# Patient Record
Sex: Female | Born: 1980 | Race: White | Hispanic: No | Marital: Single | Smoking: Never smoker
Health system: Southern US, Community
[De-identification: ages and names within clinical notes are randomized; demographics above are authoritative.]

## PROBLEM LIST (undated history)

## (undated) DIAGNOSIS — E119 Type 2 diabetes mellitus without complications: Secondary | ICD-10-CM

## (undated) HISTORY — PX: TOE AMPUTATION: SHX809

---

## 2005-11-27 ENCOUNTER — Inpatient Hospital Stay: Payer: Self-pay | Admitting: Internal Medicine

## 2008-04-19 IMAGING — CR DG CHEST 1V PORT
1 series · 1 of 1 positions shown · non-contrast
Comparison: none

REASON FOR EXAM: chest pain  weakness  [HOSPITAL]
COMMENTS:

PROCEDURE:     DXR - DXR PORTABLE CHEST SINGLE VIEW  - November 26, 2005 [DATE]
RESULT:     AP view of the chest shows the lung fields to be clear. The
heart, mediastinal and osseous structures show no significant abnormalities.

[view not recorded]
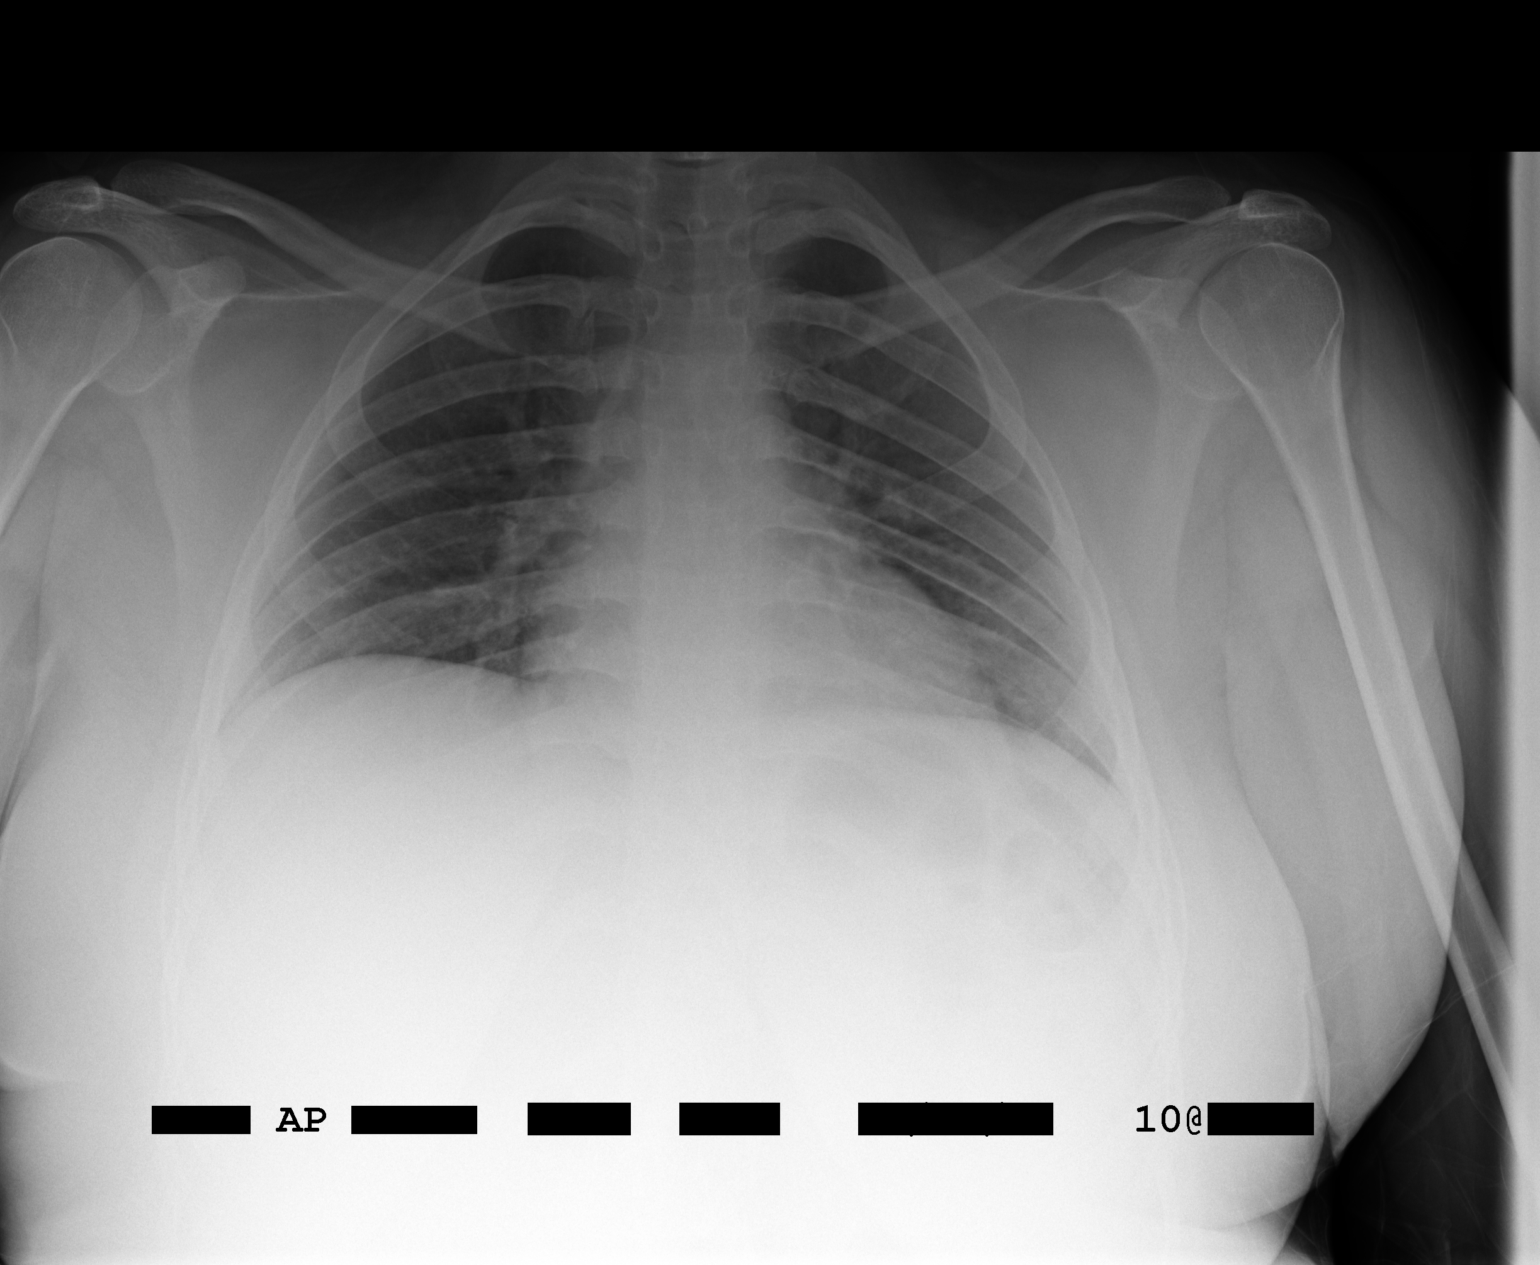

[1 of 1 positions shown; findings below may reference images not displayed]

IMPRESSION: 1)No significant abnormalities are noted.

## 2013-06-01 ENCOUNTER — Emergency Department: Payer: Self-pay | Admitting: Emergency Medicine

## 2014-10-16 ENCOUNTER — Other Ambulatory Visit
Admission: RE | Admit: 2014-10-16 | Discharge: 2014-10-16 | Disposition: A | Discharge status: Home / Self Care | Payer: No Typology Code available for payment source | Source: Ambulatory Visit | Attending: Family Medicine | Admitting: Family Medicine

## 2014-10-16 DIAGNOSIS — M869 Osteomyelitis, unspecified: Secondary | ICD-10-CM | POA: Insufficient documentation

## 2014-10-16 LAB — VANCOMYCIN, TROUGH: Vancomycin Tr: 24 ug/mL (ref 10–20)

## 2014-10-19 ENCOUNTER — Other Ambulatory Visit
Admission: RE | Admit: 2014-10-19 | Discharge: 2014-10-19 | Disposition: A | Discharge status: Home / Self Care | Payer: No Typology Code available for payment source | Source: Skilled Nursing Facility | Attending: Family Medicine | Admitting: Family Medicine

## 2014-10-19 DIAGNOSIS — F339 Major depressive disorder, recurrent, unspecified: Secondary | ICD-10-CM | POA: Insufficient documentation

## 2014-10-19 DIAGNOSIS — E119 Type 2 diabetes mellitus without complications: Secondary | ICD-10-CM | POA: Insufficient documentation

## 2014-10-19 DIAGNOSIS — Z79899 Other long term (current) drug therapy: Secondary | ICD-10-CM | POA: Insufficient documentation

## 2014-10-19 DIAGNOSIS — R279 Unspecified lack of coordination: Secondary | ICD-10-CM | POA: Diagnosis not present

## 2014-10-19 DIAGNOSIS — M86172 Other acute osteomyelitis, left ankle and foot: Secondary | ICD-10-CM | POA: Insufficient documentation

## 2014-10-19 DIAGNOSIS — E780 Pure hypercholesterolemia: Secondary | ICD-10-CM | POA: Diagnosis not present

## 2014-10-20 LAB — VANCOMYCIN, TROUGH: Vancomycin Tr: 12 ug/mL (ref 10–20)

## 2014-11-14 ENCOUNTER — Emergency Department
Admission: EM | Admit: 2014-11-14 | Discharge: 2014-11-14 | Disposition: A | Discharge status: Home / Self Care | Payer: Medicare Other

## 2017-05-04 ENCOUNTER — Emergency Department
Admission: EM | Admit: 2017-05-04 | Discharge: 2017-05-04 | Disposition: A | Discharge status: Home / Self Care | Payer: Medicare Other | Attending: Emergency Medicine | Admitting: Emergency Medicine

## 2017-05-04 DIAGNOSIS — S39012A Strain of muscle, fascia and tendon of lower back, initial encounter: Secondary | ICD-10-CM | POA: Diagnosis not present

## 2017-05-04 DIAGNOSIS — E119 Type 2 diabetes mellitus without complications: Secondary | ICD-10-CM | POA: Insufficient documentation

## 2017-05-04 DIAGNOSIS — Y9389 Activity, other specified: Secondary | ICD-10-CM | POA: Insufficient documentation

## 2017-05-04 DIAGNOSIS — Y999 Unspecified external cause status: Secondary | ICD-10-CM | POA: Insufficient documentation

## 2017-05-04 DIAGNOSIS — Z89429 Acquired absence of other toe(s), unspecified side: Secondary | ICD-10-CM | POA: Insufficient documentation

## 2017-05-04 DIAGNOSIS — Y92410 Unspecified street and highway as the place of occurrence of the external cause: Secondary | ICD-10-CM | POA: Insufficient documentation

## 2017-05-04 DIAGNOSIS — S3992XA Unspecified injury of lower back, initial encounter: Secondary | ICD-10-CM | POA: Diagnosis present

## 2017-05-04 HISTORY — DX: Type 2 diabetes mellitus without complications: E11.9

## 2017-05-04 MED ORDER — ONDANSETRON 4 MG PO TBDP
ORAL_TABLET | ORAL | Status: AC
  Filled 2017-05-04: qty 1

## 2017-05-04 MED ORDER — OXYCODONE-ACETAMINOPHEN 5-325 MG PO TABS
1.0000 | ORAL_TABLET | Freq: Once | ORAL | Status: AC
  Administered 2017-05-04: 1 via ORAL
  Filled 2017-05-04: qty 1

## 2017-05-04 MED ORDER — ONDANSETRON 4 MG PO TBDP
4.0000 mg | ORAL_TABLET | Freq: Once | ORAL | Status: AC
  Administered 2017-05-04: 4 mg via ORAL

## 2017-05-04 MED ORDER — ONDANSETRON HCL 4 MG/2ML IJ SOLN: 4.0000 mg | Freq: Once | INTRAMUSCULAR | Status: DC
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
## 2023-02-24 ENCOUNTER — Other Ambulatory Visit: Payer: Self-pay

## 2023-02-24 ENCOUNTER — Emergency Department: Payer: 59

## 2023-02-24 ENCOUNTER — Inpatient Hospital Stay
Admission: EM | Admit: 2023-02-24 | Discharge: 2023-02-26 | DRG: 065 | Disposition: A | Discharge status: Home / Self Care | Payer: 59 | Attending: Internal Medicine | Admitting: Internal Medicine

## 2023-02-24 DIAGNOSIS — Z89421 Acquired absence of other right toe(s): Secondary | ICD-10-CM

## 2023-02-24 DIAGNOSIS — Z6837 Body mass index (BMI) 37.0-37.9, adult: Secondary | ICD-10-CM

## 2023-02-24 DIAGNOSIS — I10 Essential (primary) hypertension: Secondary | ICD-10-CM | POA: Diagnosis present

## 2023-02-24 DIAGNOSIS — R270 Ataxia, unspecified: Secondary | ICD-10-CM | POA: Diagnosis present

## 2023-02-24 DIAGNOSIS — Z8619 Personal history of other infectious and parasitic diseases: Secondary | ICD-10-CM

## 2023-02-24 DIAGNOSIS — R4781 Slurred speech: Principal | ICD-10-CM

## 2023-02-24 DIAGNOSIS — R29702 NIHSS score 2: Secondary | ICD-10-CM | POA: Diagnosis present

## 2023-02-24 DIAGNOSIS — R414 Neurologic neglect syndrome: Secondary | ICD-10-CM | POA: Diagnosis present

## 2023-02-24 DIAGNOSIS — Z8249 Family history of ischemic heart disease and other diseases of the circulatory system: Secondary | ICD-10-CM

## 2023-02-24 DIAGNOSIS — F419 Anxiety disorder, unspecified: Secondary | ICD-10-CM | POA: Diagnosis present

## 2023-02-24 DIAGNOSIS — Z794 Long term (current) use of insulin: Secondary | ICD-10-CM

## 2023-02-24 DIAGNOSIS — E782 Mixed hyperlipidemia: Secondary | ICD-10-CM | POA: Diagnosis present

## 2023-02-24 DIAGNOSIS — R29810 Facial weakness: Secondary | ICD-10-CM | POA: Diagnosis present

## 2023-02-24 DIAGNOSIS — Z634 Disappearance and death of family member: Secondary | ICD-10-CM

## 2023-02-24 DIAGNOSIS — R7401 Elevation of levels of liver transaminase levels: Secondary | ICD-10-CM | POA: Diagnosis present

## 2023-02-24 DIAGNOSIS — I6523 Occlusion and stenosis of bilateral carotid arteries: Secondary | ICD-10-CM | POA: Diagnosis present

## 2023-02-24 DIAGNOSIS — I639 Cerebral infarction, unspecified: Secondary | ICD-10-CM | POA: Diagnosis present

## 2023-02-24 DIAGNOSIS — Z885 Allergy status to narcotic agent status: Secondary | ICD-10-CM

## 2023-02-24 DIAGNOSIS — R29701 NIHSS score 1: Secondary | ICD-10-CM | POA: Diagnosis not present

## 2023-02-24 DIAGNOSIS — R471 Dysarthria and anarthria: Secondary | ICD-10-CM | POA: Diagnosis present

## 2023-02-24 DIAGNOSIS — E1140 Type 2 diabetes mellitus with diabetic neuropathy, unspecified: Secondary | ICD-10-CM | POA: Diagnosis present

## 2023-02-24 DIAGNOSIS — R296 Repeated falls: Secondary | ICD-10-CM | POA: Diagnosis present

## 2023-02-24 DIAGNOSIS — Z7902 Long term (current) use of antithrombotics/antiplatelets: Secondary | ICD-10-CM

## 2023-02-24 DIAGNOSIS — E8809 Other disorders of plasma-protein metabolism, not elsewhere classified: Secondary | ICD-10-CM | POA: Diagnosis present

## 2023-02-24 DIAGNOSIS — E669 Obesity, unspecified: Secondary | ICD-10-CM | POA: Diagnosis present

## 2023-02-24 DIAGNOSIS — N390 Urinary tract infection, site not specified: Secondary | ICD-10-CM | POA: Insufficient documentation

## 2023-02-24 DIAGNOSIS — Z5941 Food insecurity: Secondary | ICD-10-CM

## 2023-02-24 DIAGNOSIS — R4586 Emotional lability: Secondary | ICD-10-CM | POA: Diagnosis present

## 2023-02-24 DIAGNOSIS — Z5986 Financial insecurity: Secondary | ICD-10-CM

## 2023-02-24 DIAGNOSIS — Z7982 Long term (current) use of aspirin: Secondary | ICD-10-CM

## 2023-02-24 DIAGNOSIS — E441 Mild protein-calorie malnutrition: Secondary | ICD-10-CM | POA: Diagnosis present

## 2023-02-24 DIAGNOSIS — Z888 Allergy status to other drugs, medicaments and biological substances status: Secondary | ICD-10-CM

## 2023-02-24 DIAGNOSIS — Z79899 Other long term (current) drug therapy: Secondary | ICD-10-CM

## 2023-02-24 DIAGNOSIS — N3000 Acute cystitis without hematuria: Secondary | ICD-10-CM | POA: Diagnosis present

## 2023-02-24 DIAGNOSIS — R748 Abnormal levels of other serum enzymes: Secondary | ICD-10-CM | POA: Diagnosis present

## 2023-02-24 DIAGNOSIS — I63431 Cerebral infarction due to embolism of right posterior cerebral artery: Secondary | ICD-10-CM | POA: Diagnosis not present

## 2023-02-24 DIAGNOSIS — Z5948 Other specified lack of adequate food: Secondary | ICD-10-CM

## 2023-02-24 DIAGNOSIS — Z881 Allergy status to other antibiotic agents status: Secondary | ICD-10-CM

## 2023-02-24 DIAGNOSIS — E1165 Type 2 diabetes mellitus with hyperglycemia: Secondary | ICD-10-CM | POA: Diagnosis present

## 2023-02-24 DIAGNOSIS — Z89422 Acquired absence of other left toe(s): Secondary | ICD-10-CM

## 2023-02-24 LAB — COMPREHENSIVE METABOLIC PANEL
ALT: 53 U/L — ABNORMAL HIGH (ref 0–44)
AST: 50 U/L — ABNORMAL HIGH (ref 15–41)
Albumin: 3.3 g/dL — ABNORMAL LOW (ref 3.5–5.0)
Alkaline Phosphatase: 144 U/L — ABNORMAL HIGH (ref 38–126)
Anion gap: 11 (ref 5–15)
BUN: 23 mg/dL — ABNORMAL HIGH (ref 6–20)
CO2: 23 mmol/L (ref 22–32)
Calcium: 8.8 mg/dL — ABNORMAL LOW (ref 8.9–10.3)
Chloride: 104 mmol/L (ref 98–111)
Creatinine, Ser: 0.86 mg/dL (ref 0.44–1.00)
GFR, Estimated: >60 mL/min (ref >60)
Glucose, Bld: 162 mg/dL — ABNORMAL HIGH (ref 70–99)
Potassium: 5.1 mmol/L (ref 3.5–5.1)
Sodium: 138 mmol/L (ref 135–145)
Total Bilirubin: 0.6 mg/dL (ref 0.0–1.2)
Total Protein: 7.8 g/dL (ref 6.5–8.1)

## 2023-02-24 LAB — DIFFERENTIAL
Abs Immature Granulocytes: 0.02 10*3/uL (ref 0.00–0.07)
Basophils Absolute: 0 10*3/uL (ref 0.0–0.1)
Basophils Relative: 0 %
Eosinophils Absolute: 0.3 10*3/uL (ref 0.0–0.5)
Eosinophils Relative: 4 %
Immature Granulocytes: 0 %
Lymphocytes Relative: 49 %
Lymphs Abs: 3.7 10*3/uL (ref 0.7–4.0)
Monocytes Absolute: 0.5 10*3/uL (ref 0.1–1.0)
Monocytes Relative: 7 %
Neutro Abs: 3.1 10*3/uL (ref 1.7–7.7)
Neutrophils Relative %: 40 %

## 2023-02-24 LAB — URINALYSIS, ROUTINE W REFLEX MICROSCOPIC
Bilirubin Urine: NEGATIVE
Glucose, UA: NEGATIVE mg/dL
Ketones, ur: NEGATIVE mg/dL
Nitrite: POSITIVE — AB
Protein, ur: 100 mg/dL — AB
Specific Gravity, Urine: 1.012 (ref 1.005–1.030)
pH: 5 (ref 5.0–8.0)

## 2023-02-24 LAB — URINE DRUG SCREEN, QUALITATIVE (ARMC ONLY)
Amphetamines, Ur Screen: NOT DETECTED
Barbiturates, Ur Screen: NOT DETECTED
Benzodiazepine, Ur Scrn: NOT DETECTED
Cannabinoid 50 Ng, Ur ~~LOC~~: NOT DETECTED
Cocaine Metabolite,Ur ~~LOC~~: NOT DETECTED
MDMA (Ecstasy)Ur Screen: NOT DETECTED
Methadone Scn, Ur: NOT DETECTED
Opiate, Ur Screen: NOT DETECTED
Phencyclidine (PCP) Ur S: NOT DETECTED
Tricyclic, Ur Screen: NOT DETECTED

## 2023-02-24 LAB — CBC
HCT: 38.7 % (ref 36.0–46.0)
Hemoglobin: 12.6 g/dL (ref 12.0–15.0)
MCH: 27.1 pg (ref 26.0–34.0)
MCHC: 32.6 g/dL (ref 30.0–36.0)
MCV: 83.2 fL (ref 80.0–100.0)
Platelets: 220 10*3/uL (ref 150–400)
RBC: 4.65 MIL/uL (ref 3.87–5.11)
RDW: 13.8 % (ref 11.5–15.5)
WBC: 7.7 10*3/uL (ref 4.0–10.5)
nRBC: 0 % (ref 0.0–0.2)

## 2023-02-24 LAB — PROTIME-INR
INR: 0.9 (ref 0.8–1.2)
Prothrombin Time: 12.8 s (ref 11.4–15.2)

## 2023-02-24 LAB — CBG MONITORING, ED: Glucose-Capillary: 153 mg/dL — ABNORMAL HIGH (ref 70–99)

## 2023-02-24 LAB — APTT: aPTT: 25 s (ref 24–36)

## 2023-02-24 LAB — ETHANOL: Alcohol, Ethyl (B): <10 mg/dL (ref <10)

## 2023-02-24 MED ORDER — SODIUM CHLORIDE 0.9 % IV SOLN
1.0000 g | Freq: Once | INTRAVENOUS | Status: AC
  Administered 2023-02-24: 1 g via INTRAVENOUS
  Filled 2023-02-24: qty 10

## 2023-02-24 NOTE — Progress Notes (Signed)
2133 - Code Stroke Activated   LKWT unknown, Patient on phone with her sister when her sister noticed a L facial droop. Patient complains of generalized weakness from flu, diagnosed last week. Patient has slurred/slowed speech unsure if this is new or baseline base doff EMS report family said baseline, but patient states it is different from normal. Also, complaining of left hand numbness.  mRS 0  Patient already been to CT and returned   2140 - Tele-Neurologist paged   2150 - Dr. Patricia Nettle joined stroke cart, CT head results given on camera

## 2023-02-24 NOTE — ED Triage Notes (Addendum)
Patient arrives from home via EMS and states that her sister noticed a left-sided facial droop at approximately 8PM. Patient states that she has generalized weakness for about a week. She is also noticing slowed speech and some slight slurring. Blood sugar 153

## 2023-02-24 NOTE — ED Notes (Signed)
Pt advised she was just seem at [facility] at 6pm for feeling so bad, but her mask was up the entire time so unknown if they would have noticed her facial droop either.  Pt had FLU last week.

## 2023-02-24 NOTE — ED Notes (Signed)
Tele- Neuro on screen.

## 2023-02-24 NOTE — ED Notes (Signed)
Neuro decided against TNK.

## 2023-02-24 NOTE — ED Notes (Signed)
Called to carelink/activated code stroke per MD Smith/rep Asher Muir.

## 2023-02-24 NOTE — ED Provider Notes (Addendum)
[facility] Provider Note    Event Date/Time   First MD Initiated Contact with Patient 02/24/23 2134     (approximate)   History   Facial Droop   HPI  Angel Gray is a 43 y.o. female who presents to the ED for evaluation of Facial Droop   Patient presents from home via POV for elevation of facial droop and slurred speech since 8 PM.  No falls, trauma, syncope or injuries.  Recently diagnosed with influenza A.  I review a PCP visit from 1 month ago.  Obese patient with history of HTN, HLD and DM.   Physical Exam   Triage Vital Signs: ED Triage Vitals  Encounter Vitals Group     BP 02/24/23 2119 (!) 210/83     Systolic BP Percentile --      Diastolic BP Percentile --      Pulse Rate 02/24/23 2119 97     Resp 02/24/23 2119 18     Temp --      Temp src --      SpO2 02/24/23 2119 95 %     Weight --      Height 02/24/23 2138 5\' 1"  (1.549 m)     Head Circumference --      Peak Flow --      Pain Score 02/24/23 2138 0     Pain Loc --      Pain Education --      Exclude from Growth Chart --     Most recent vital signs: Vitals:   02/24/23 2145 02/24/23 2330  BP:  (!) 158/72  Pulse: 91 86  Resp: 18 15  Temp:    SpO2: 97% 97%    General: Awake, no distress.  CV:  Good peripheral perfusion.  Resp:  Normal effort.  Abd:  No distention.  MSK:  No deformity noted.  Neuro:  No focal deficits appreciated.  Full strength and sensation to all 4 extremities but facial droop and asymmetry is noted as well as slurred speech and word finding difficulties. Other:     ED Results / Procedures / Treatments   Labs (all labs ordered are listed, but only abnormal results are displayed) Labs Reviewed  COMPREHENSIVE METABOLIC PANEL - Abnormal; Notable for the following components:      Result Value   Glucose, Bld 162 (*)    BUN 23 (*)    Calcium 8.8 (*)    Albumin 3.3 (*)    AST 50 (*)    ALT 53 (*)    Alkaline Phosphatase 144 (*)    All other  components within normal limits  URINALYSIS, ROUTINE W REFLEX MICROSCOPIC - Abnormal; Notable for the following components:   Color, Urine YELLOW (*)    APPearance HAZY (*)    Hgb urine dipstick SMALL (*)    Protein, ur 100 (*)    Nitrite POSITIVE (*)    Leukocytes,Ua TRACE (*)    Bacteria, UA MANY (*)    All other components within normal limits  CBG MONITORING, ED - Abnormal; Notable for the following components:   Glucose-Capillary 153 (*)    All other components within normal limits  URINE CULTURE  ETHANOL  PROTIME-INR  APTT  CBC  DIFFERENTIAL  URINE DRUG SCREEN, QUALITATIVE ([facility] ONLY)  POC URINE PREG, ED    EKG Sinus rhythm with a rate of 93 bpm.  Normal axis and intervals.  No clear signs of acute ischemia.  RADIOLOGY CT  head interpreted by me without evidence of acute intracranial pathology  Official radiology report(s): MR BRAIN WO CONTRAST Result Date: 02/24/2023 CLINICAL DATA:  eval cva. slurred speech and facial droop EXAM: MRI HEAD WITHOUT CONTRAST TECHNIQUE: Multiplanar, multiecho pulse sequences of the brain and surrounding structures were obtained without intravenous contrast. COMPARISON:  CT head. FINDINGS: Brain: Multiple small acute infarcts in the right frontal lobe and right parietal lobe. Additional small acute infarct in the medial right temporal lobe and infarct in the body of the corpus callosum. Edema without substantial mass effect. Remote left frontal cortical infarct. Mild to moderate scattered T2/FLAIR hyperintensities not matter compatible with chronic microvascular disease. Vascular: Major arterial flow voids are maintained. Skull and upper cervical spine: Normal marrow signal. Sinuses/Orbits: Moderate paranasal sinus mucosal thickening. No acute orbital findings. Other: No sizable mastoid effusions. 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 Electronically Signed   By:  Feliberto Harts M.D.   On: 02/24/2023 23:21   CT HEAD CODE STROKE WO CONTRAST Result Date: 02/24/2023 CLINICAL DATA:  Code stroke.  Slurred speech and facial droop EXAM: CT HEAD WITHOUT CONTRAST TECHNIQUE: Contiguous axial images were obtained from the base of the skull through the vertex without intravenous contrast. RADIATION DOSE REDUCTION: This exam was performed according to the departmental dose-optimization program which includes automated exposure control, adjustment of the mA and/or kV according to patient size and/or use of iterative reconstruction technique. COMPARISON:  None Available. FINDINGS: Brain: There is no mass, hemorrhage or extra-axial collection. The size and configuration of the ventricles and extra-axial CSF spaces are normal. The brain parenchyma is normal, without evidence of acute or chronic infarction. Incidental partially empty sella turcica. Vascular: No abnormal hyperdensity of the major intracranial arteries or dural venous sinuses. No intracranial atherosclerosis. Skull: The visualized skull base, calvarium and extracranial soft tissues are normal. Sinuses/Orbits: No fluid levels or advanced mucosal thickening of the visualized paranasal sinuses. No mastoid or middle ear effusion. The orbits are normal. ASPECTS [facility] Stroke Program Early CT Score) - Ganglionic level infarction (caudate, lentiform nuclei, internal capsule, insula, M1-M3 cortex): 7 - Supraganglionic infarction (M4-M6 cortex): 3 Total score (0-10 with 10 being normal): 10 IMPRESSION: 1. No acute intracranial abnormality. 2. ASPECTS is 10. These results were called by telephone at the time of interpretation on 02/24/2023 at 9:44 pm to provider [facility] , who verbally acknowledged these results. Electronically Signed   By: Deatra Robinson M.D.   On: 02/24/2023 21:44    PROCEDURES and INTERVENTIONS:  .1-3 Lead EKG Interpretation  Performed by: Delton Prairie, MD Authorized by: Delton Prairie, MD      Interpretation: normal     ECG rate:  90   ECG rate assessment: normal     Rhythm: sinus rhythm     Ectopy: none     Conduction: normal   .Critical Care  Performed by: Delton Prairie, MD Authorized by: Delton Prairie, MD   Critical care provider statement:    Critical care time (minutes):  30   Critical care time was exclusive of:  Separately billable procedures and treating other patients   Critical care was necessary to treat or prevent imminent or life-threatening deterioration of the following conditions:  CNS failure or compromise   Critical care was time spent personally by me on the following activities:  Development of treatment plan with patient or surrogate, discussions with consultants, evaluation of patient's response to treatment, examination of  patient, ordering and review of laboratory studies, ordering and review of radiographic studies, ordering and performing treatments and interventions, pulse oximetry, re-evaluation of patient's condition and review of old charts   Medications  cefTRIAXone (ROCEPHIN) 1 g in sodium chloride 0.9 % 100 mL IVPB (1 g Intravenous New Bag/Given 02/24/23 2328)     IMPRESSION / MDM / ASSESSMENT AND PLAN / ED COURSE  I reviewed the triage vital signs and the nursing notes.  Differential diagnosis includes, but is not limited to, bell's palsy, hemorrhagic stroke, ischemic stroke, DKA, metabolic encephalopathy  {Patient presents with symptoms of an acute illness or injury that is potentially life-threatening.  Patient presents with acute facial droop and slurred speech.  I do not appreciate any neurologic deficits to the extremities, and I considered Bell's palsy but her speech gives me primary concern in addition to her facial droop which is why we activated code stroke protocols.  CT head without bleed and awaiting MRI.  Urine with some infectious features and she does report some urinary frequency but no signs of sepsis.  Normal CBC.  Slight LFT  derangements without abdominal pain, emesis.  Normal bilirubin.  Pending MRI and signed out to oncoming physician to follow-up on the study  Clinical Course as of 02/24/23 2337  Wynelle Link Feb 24, 2023  2142 Call from rads about CT head.  [DS]  2152 Reassessed. Tele-Neuro talking with patient [DS]  2214 Spoke with TeleNeuro, recommend MRI [DS]  2335 Reassessed and educate the patient of stroke on MRI, plan of care.  Answered her questions. [DS]    Clinical Course User Index [DS] Delton Prairie, MD     FINAL CLINICAL IMPRESSION(S) / ED DIAGNOSES   Final diagnoses:  Slurred speech  Facial droop  Acute cystitis without hematuria     Rx / DC Orders   ED Discharge Orders     None        Note:  This document was prepared using Dragon voice recognition software and may include unintentional dictation errors.   Delton Prairie, MD 02/24/23 6295    Delton Prairie, MD 02/24/23 [phone]

## 2023-02-24 NOTE — ED Triage Notes (Signed)
Pt presents via EMS from home. EMS reports at 1930 tonight pt had left sided facial droop per family. EMS reports no facial droop. EMS reports left sided had numbness. NIH 0 LVH 0 per EMS.   CBG 182 per EMS.   Flu + per EMS with cough. Seen at ED today and was d/c per EMS.   EMS reports pt has slurred speech but family reported it was pt baseline.

## 2023-02-24 NOTE — Consult Note (Signed)
[facility] Tele[facility]urology Consult Services   Patient Name:   Angel Gray, Angel Gray Date of Birth:   1980-05-28 Identification Number:   MRN - [id number] Date of Service:   02/24/2023 21:40:53  Diagnosis:       G45.9 - Transient cerebral ischemic attack, unspecified  Impression:      On exam patient clearly forced her tongue to deviate to the left  Exam reveals inconsistency / reversibility of symptoms  I will not recommend IV thrombolytics as patient is out of the time window for treatment  Plan will be to admit for MRI / MRA h/n  If passes bedside swallow, ok for ASA      Our recommendations are outlined below.  Recommendations:        Stroke/Telemetry Floor       Neuro Checks       Bedside Swallow Eval       DVT Prophylaxis       IV Fluids, Normal Saline       Head of Bed 30 Degrees       Euglycemia and Avoid Hyperthermia (PRN Acetaminophen)       Initiate or continue Aspirin 81 MG daily       Antihypertensives PRN if Blood pressure is greater than 220/120 or there is a concern for End organ damage/contraindications for permissive HTN. If blood pressure is greater than 220/120 give labetalol PO or IV or Vasotec IV with a goal of 15% reduction in BP during the first 24 hours.  Sign Out:       Discussed with Emergency Department Provider    ------------------------------------------------------------------------------  Advanced Imaging: Advanced Imaging Deferred because:  Non-disabling symptoms as verified by the patient; no cortical signs so not consistent with LVO  Stroke not suspected with clinical presentation and exam   Metrics: Last Known Well: Unknown Dispatch Time: 02/24/2023 21:40:53 Arrival Time: 02/24/2023 21:00:00 Initial Response Time: 02/24/2023 21:48:25 Symptoms: L facial droop . Initial patient interaction: 02/24/2023 21:52:00 NIHSS Assessment Completed: 02/24/2023 21:54:16 Patient is not a candidate for Thrombolytic. Thrombolytic  Medical Decision: 02/24/2023 21:54:19 Patient was not deemed candidate for Thrombolytic because of following reasons: LKW outside 4.5 hr window. .  CT head showed no acute hemorrhage or acute core infarct.  Primary Provider Notified of Diagnostic Impression and Management Plan on: 02/24/2023 22:25:24    ------------------------------------------------------------------------------  History of Present Illness: Patient is a 43 year old Female.  Patient was brought by EMS for symptoms of L facial droop . 9q year old female w PMH sig for hypertension, DM and hyperlipidemia - unknown last normal time - presents to the ED w slurred speech and L facial droop Patient states her L side of her face feels heavy and she has numbness in her L arm as well Denies any neck pain    Past Medical History:      Hypertension      Diabetes Mellitus      Hyperlipidemia  Medications:  No Anticoagulant use  No Antiplatelet use Reviewed EMR for current medications  Allergies:  Reviewed  Social History: Drug Use: No  Family History:  There is no family history of premature cerebrovascular disease pertinent to this consultation  ROS : 14 Points Review of Systems was performed and was negative except mentioned in HPI.  Past Surgical History: There Is No Surgical History Contributory To Today's Visit     Examination: BP(188/83), Pulse(91), Blood Glucose(153) 1A: Level of Consciousness - Alert; keenly responsive + 0 1B: Ask Month and Age -  Both Questions Right + 0 1C: Blink Eyes & Squeeze Hands - Performs Both Tasks + 0 2: Test Horizontal Extraocular Movements - Normal + 0 3: Test Visual Fields - No Visual Loss + 0 4: Test Facial Palsy (Use Grimace if Obtunded) - Minor paralysis (flat nasolabial fold, smile asymmetry) + 1 5A: Test Left Arm Motor Drift - No Drift for 10 Seconds + 0 5B: Test Right Arm Motor Drift - No Drift for 10 Seconds + 0 6A: Test Left Leg Motor Drift - No Drift for 5  Seconds + 0 6B: Test Right Leg Motor Drift - No Drift for 5 Seconds + 0 7: Test Limb Ataxia (FNF/Heel-Shin) - No Ataxia + 0 8: Test Sensation - Normal; No sensory loss + 0 9: Test Language/Aphasia - Normal; No aphasia + 0 10: Test Dysarthria - Mild-Moderate Dysarthria: Slurring but can be understood + 1 11: Test Extinction/Inattention - No abnormality + 0  NIHSS Score: 2   Pre-Morbid Modified Rankin Scale: 0 Points = No symptoms at all  Spoke with : DR [facility] - VIA EPIC CHAT  This consult was conducted in real time using interactive audio and Immunologist. Patient was informed of the technology being used for this visit and agreed to proceed. Patient located in hospital and provider located at home/office setting.   Patient is being evaluated for possible acute neurologic impairment and high probability of imminent or life-threatening deterioration. I spent total of 35 minutes providing care to this patient, including time for face to face visit via telemedicine, review of medical records, imaging studies and discussion of findings with providers, the patient and/or family.   Dr Glena Norfolk   [facility] For Inpatient follow-up with [facility] physician please call [facility] at [phone]. As we are not an outpatient service for any post hospital discharge needs please contact the hospital for assistance. If you have any questions for the [facility] physicians or need to reconsult for clinical or diagnostic changes please contact us via [facility] at [phone].

## 2023-02-25 ENCOUNTER — Observation Stay: Payer: 59

## 2023-02-25 ENCOUNTER — Observation Stay (HOSPITAL_BASED_OUTPATIENT_CLINIC_OR_DEPARTMENT_OTHER)
Admit: 2023-02-25 | Discharge: 2023-02-25 | Disposition: A | Discharge status: Home / Self Care | Payer: 59 | Attending: Internal Medicine

## 2023-02-25 DIAGNOSIS — E8809 Other disorders of plasma-protein metabolism, not elsewhere classified: Secondary | ICD-10-CM | POA: Diagnosis present

## 2023-02-25 DIAGNOSIS — R4781 Slurred speech: Secondary | ICD-10-CM | POA: Diagnosis not present

## 2023-02-25 DIAGNOSIS — I6522 Occlusion and stenosis of left carotid artery: Secondary | ICD-10-CM | POA: Diagnosis not present

## 2023-02-25 DIAGNOSIS — I6523 Occlusion and stenosis of bilateral carotid arteries: Secondary | ICD-10-CM | POA: Diagnosis present

## 2023-02-25 DIAGNOSIS — E46 Unspecified protein-calorie malnutrition: Secondary | ICD-10-CM

## 2023-02-25 DIAGNOSIS — R29810 Facial weakness: Secondary | ICD-10-CM

## 2023-02-25 DIAGNOSIS — I6389 Other cerebral infarction: Secondary | ICD-10-CM | POA: Diagnosis not present

## 2023-02-25 DIAGNOSIS — E782 Mixed hyperlipidemia: Secondary | ICD-10-CM | POA: Diagnosis present

## 2023-02-25 DIAGNOSIS — Z881 Allergy status to other antibiotic agents status: Secondary | ICD-10-CM | POA: Diagnosis not present

## 2023-02-25 DIAGNOSIS — R29701 NIHSS score 1: Secondary | ICD-10-CM | POA: Diagnosis not present

## 2023-02-25 DIAGNOSIS — I63431 Cerebral infarction due to embolism of right posterior cerebral artery: Secondary | ICD-10-CM | POA: Diagnosis present

## 2023-02-25 DIAGNOSIS — F419 Anxiety disorder, unspecified: Secondary | ICD-10-CM | POA: Diagnosis present

## 2023-02-25 DIAGNOSIS — E441 Mild protein-calorie malnutrition: Secondary | ICD-10-CM | POA: Diagnosis present

## 2023-02-25 DIAGNOSIS — R7401 Elevation of levels of liver transaminase levels: Secondary | ICD-10-CM | POA: Insufficient documentation

## 2023-02-25 DIAGNOSIS — R414 Neurologic neglect syndrome: Secondary | ICD-10-CM | POA: Diagnosis present

## 2023-02-25 DIAGNOSIS — I10 Essential (primary) hypertension: Secondary | ICD-10-CM | POA: Insufficient documentation

## 2023-02-25 DIAGNOSIS — E1165 Type 2 diabetes mellitus with hyperglycemia: Secondary | ICD-10-CM | POA: Diagnosis present

## 2023-02-25 DIAGNOSIS — N390 Urinary tract infection, site not specified: Secondary | ICD-10-CM | POA: Diagnosis not present

## 2023-02-25 DIAGNOSIS — Z8249 Family history of ischemic heart disease and other diseases of the circulatory system: Secondary | ICD-10-CM | POA: Diagnosis not present

## 2023-02-25 DIAGNOSIS — Z7902 Long term (current) use of antithrombotics/antiplatelets: Secondary | ICD-10-CM | POA: Diagnosis not present

## 2023-02-25 DIAGNOSIS — N3000 Acute cystitis without hematuria: Secondary | ICD-10-CM | POA: Diagnosis present

## 2023-02-25 DIAGNOSIS — R29702 NIHSS score 2: Secondary | ICD-10-CM | POA: Diagnosis present

## 2023-02-25 DIAGNOSIS — Z6837 Body mass index (BMI) 37.0-37.9, adult: Secondary | ICD-10-CM | POA: Diagnosis not present

## 2023-02-25 DIAGNOSIS — Z794 Long term (current) use of insulin: Secondary | ICD-10-CM | POA: Diagnosis not present

## 2023-02-25 DIAGNOSIS — Z7982 Long term (current) use of aspirin: Secondary | ICD-10-CM | POA: Diagnosis not present

## 2023-02-25 DIAGNOSIS — I6521 Occlusion and stenosis of right carotid artery: Secondary | ICD-10-CM | POA: Diagnosis not present

## 2023-02-25 DIAGNOSIS — R296 Repeated falls: Secondary | ICD-10-CM | POA: Diagnosis present

## 2023-02-25 DIAGNOSIS — E1140 Type 2 diabetes mellitus with diabetic neuropathy, unspecified: Secondary | ICD-10-CM | POA: Diagnosis present

## 2023-02-25 DIAGNOSIS — Z885 Allergy status to narcotic agent status: Secondary | ICD-10-CM | POA: Diagnosis not present

## 2023-02-25 DIAGNOSIS — I639 Cerebral infarction, unspecified: Secondary | ICD-10-CM | POA: Diagnosis present

## 2023-02-25 DIAGNOSIS — E669 Obesity, unspecified: Secondary | ICD-10-CM | POA: Diagnosis present

## 2023-02-25 LAB — HIV ANTIBODY (ROUTINE TESTING W REFLEX): HIV Screen 4th Generation wRfx: NONREACTIVE

## 2023-02-25 LAB — COMPREHENSIVE METABOLIC PANEL
ALT: 45 U/L — ABNORMAL HIGH (ref 0–44)
AST: 33 U/L (ref 15–41)
Albumin: 2.9 g/dL — ABNORMAL LOW (ref 3.5–5.0)
Alkaline Phosphatase: 128 U/L — ABNORMAL HIGH (ref 38–126)
Anion gap: 11 (ref 5–15)
BUN: 21 mg/dL — ABNORMAL HIGH (ref 6–20)
CO2: 22 mmol/L (ref 22–32)
Calcium: 8.3 mg/dL — ABNORMAL LOW (ref 8.9–10.3)
Chloride: 104 mmol/L (ref 98–111)
Creatinine, Ser: 0.75 mg/dL (ref 0.44–1.00)
GFR, Estimated: >60 mL/min (ref >60)
Glucose, Bld: 245 mg/dL — ABNORMAL HIGH (ref 70–99)
Potassium: 4.9 mmol/L (ref 3.5–5.1)
Sodium: 137 mmol/L (ref 135–145)
Total Bilirubin: 0.4 mg/dL (ref 0.0–1.2)
Total Protein: 7.1 g/dL (ref 6.5–8.1)

## 2023-02-25 LAB — ECHOCARDIOGRAM COMPLETE
AR max vel: 2.45 cm2
AV Area VTI: 2.56 cm2
AV Area mean vel: 2.39 cm2
AV Mean grad: 4 mm[Hg]
AV Peak grad: 8 mm[Hg]
Ao pk vel: 1.41 m/s
Area-P 1/2: 3.99 cm2
Height: 61 in
MV VTI: 2.58 cm2
S' Lateral: 3 cm
Weight: 3148.8 [oz_av]

## 2023-02-25 LAB — CBG MONITORING, ED
Glucose-Capillary: 226 mg/dL — ABNORMAL HIGH (ref 70–99)
Glucose-Capillary: 316 mg/dL — ABNORMAL HIGH (ref 70–99)
Glucose-Capillary: 271 mg/dL — ABNORMAL HIGH (ref 70–99)
Glucose-Capillary: 262 mg/dL — ABNORMAL HIGH (ref 70–99)

## 2023-02-25 LAB — MAGNESIUM: Magnesium: 1.9 mg/dL (ref 1.7–2.4)

## 2023-02-25 LAB — CBC
HCT: 34.6 % — ABNORMAL LOW (ref 36.0–46.0)
Hemoglobin: 11.5 g/dL — ABNORMAL LOW (ref 12.0–15.0)
MCH: 27.4 pg (ref 26.0–34.0)
MCHC: 33.2 g/dL (ref 30.0–36.0)
MCV: 82.6 fL (ref 80.0–100.0)
Platelets: 226 10*3/uL (ref 150–400)
RBC: 4.19 MIL/uL (ref 3.87–5.11)
RDW: 13.9 % (ref 11.5–15.5)
WBC: 6.9 10*3/uL (ref 4.0–10.5)
nRBC: 0 % (ref 0.0–0.2)

## 2023-02-25 LAB — LIPID PANEL
Cholesterol: 141 mg/dL (ref 0–200)
HDL: 27 mg/dL — ABNORMAL LOW (ref >40)
LDL Cholesterol: 80 mg/dL (ref 0–99)
Total CHOL/HDL Ratio: 5.2 {ratio}
Triglycerides: 168 mg/dL — ABNORMAL HIGH (ref <150)
VLDL: 34 mg/dL (ref 0–40)

## 2023-02-25 LAB — HEMOGLOBIN A1C
Hgb A1c MFr Bld: 8.1 % — ABNORMAL HIGH (ref 4.8–5.6)
Mean Plasma Glucose: 185.77 mg/dL

## 2023-02-25 LAB — PHOSPHORUS: Phosphorus: 3.9 mg/dL (ref 2.5–4.6)

## 2023-02-25 MED ORDER — BENZOCAINE 20 % MT SOLN
Freq: Once | OROMUCOSAL | Status: DC
  Filled 2023-02-25: qty 1

## 2023-02-25 MED ORDER — ACETAMINOPHEN 325 MG PO TABS: 650.0000 mg | ORAL_TABLET | Freq: Four times a day (QID) | ORAL | Status: DC | PRN

## 2023-02-25 MED ORDER — CLOPIDOGREL BISULFATE 75 MG PO TABS
225.0000 mg | ORAL_TABLET | Freq: Once | ORAL | Status: DC
  Filled 2023-02-25: qty 3

## 2023-02-25 MED ORDER — ASPIRIN 81 MG PO TBEC
81.0000 mg | DELAYED_RELEASE_TABLET | Freq: Every day | ORAL | Status: DC
  Administered 2023-02-25 – 2023-02-26 (×2): 81 mg via ORAL
  Filled 2023-02-25 (×2): qty 1

## 2023-02-25 MED ORDER — PROCHLORPERAZINE EDISYLATE 10 MG/2ML IJ SOLN: 10.0000 mg | Freq: Four times a day (QID) | INTRAMUSCULAR | Status: DC | PRN

## 2023-02-25 MED ORDER — IOHEXOL 350 MG/ML SOLN
75.0000 mL | Freq: Once | INTRAVENOUS | Status: AC | PRN
  Administered 2023-02-25: 75 mL via INTRAVENOUS

## 2023-02-25 MED ORDER — SODIUM CHLORIDE 0.9 % IV SOLN
1.0000 g | INTRAVENOUS | Status: DC
  Administered 2023-02-25: 1 g via INTRAVENOUS
  Filled 2023-02-25: qty 10

## 2023-02-25 MED ORDER — CLOPIDOGREL BISULFATE 75 MG PO TABS
75.0000 mg | ORAL_TABLET | Freq: Every day | ORAL | Status: DC
  Administered 2023-02-25: 75 mg via ORAL
  Filled 2023-02-25: qty 1

## 2023-02-25 MED ORDER — ROSUVASTATIN CALCIUM 20 MG PO TABS
40.0000 mg | ORAL_TABLET | Freq: Every day | ORAL | Status: DC
  Administered 2023-02-25 – 2023-02-26 (×2): 40 mg via ORAL
  Filled 2023-02-25 (×2): qty 2

## 2023-02-25 MED ORDER — STROKE: EARLY STAGES OF RECOVERY BOOK: Freq: Once | Status: DC

## 2023-02-25 MED ORDER — INSULIN ASPART 100 UNIT/ML IJ SOLN
0.0000 [IU] | Freq: Three times a day (TID) | INTRAMUSCULAR | Status: DC
  Administered 2023-02-25: 8 [IU] via SUBCUTANEOUS
  Administered 2023-02-25: 11 [IU] via SUBCUTANEOUS
  Administered 2023-02-25: 5 [IU] via SUBCUTANEOUS
  Administered 2023-02-26: 8 [IU] via SUBCUTANEOUS
  Filled 2023-02-25 (×4): qty 1

## 2023-02-25 MED ORDER — ACETAMINOPHEN 325 MG RE SUPP: 650.0000 mg | Freq: Four times a day (QID) | RECTAL | Status: DC | PRN

## 2023-02-25 MED ORDER — GLUCERNA SHAKE PO LIQD
237.0000 mL | Freq: Three times a day (TID) | ORAL | Status: DC
  Administered 2023-02-25 – 2023-02-26 (×3): 237 mL via ORAL

## 2023-02-25 MED ORDER — ENOXAPARIN SODIUM 60 MG/0.6ML IJ SOSY
0.5000 mg/kg | PREFILLED_SYRINGE | INTRAMUSCULAR | Status: DC
  Filled 2023-02-25 (×2): qty 0.6

## 2023-02-25 MED ORDER — BENZOCAINE 20 % MT SOLN: Freq: Two times a day (BID) | OROMUCOSAL | Status: DC | PRN

## 2023-02-25 NOTE — Progress Notes (Signed)
No charge progress note.  Angel Gray is a 43 y.o. female with medical history significant of type 2 diabetes mellitus, hypertension, hyperlipidemia who presents to the emergency department for evaluation of facial droop, slurred speech and left hand numbness.   On presentation patient has significantly elevated blood pressure at 210/83, blood glucose 162, BUN 23, AST 50, ALT 53, ALP 144.  UA positive for UTI, negative UDS. CT head without contrast showed no acute intracranial abnormality MRI brain without contrast showed acute infarcts in the right frontal, parietal and temporal lobes and right body of right corpus callosum. IV ceftriaxone was given due to UTI Teleneurology was consulted and recommended further stroke workup.  2/3: Hemodynamically stable, multiple acute infarcts.  Improving transaminitis, Lipid panel with triglyceride of 168, HDL 27 and LDL of 80, urine cultures pending.  Echocardiogram with normal EF and grade 1 diastolic dysfunction, no other significant abnormality, no septal defect.  Swallow evaluation with mild oral deficit, recommending speech therapy on discharge.  Patient was resting comfortably stating that she is very tired when seen today.  No new deficit.  Still feels little dysarthric.  Rest of the exam was benign.  Pending CTA, PT and OT evaluation. Started on aspirin, Plavix and Crestor. Likely will need ZIO monitor on discharge

## 2023-02-25 NOTE — Progress Notes (Signed)
*  PRELIMINARY RESULTS* Echocardiogram 2D Echocardiogram has been performed.  Carolyne Fiscal 02/25/2023, 11:53 AM

## 2023-02-25 NOTE — Evaluation (Signed)
Physical Therapy Evaluation Patient Details Name: Angel Gray MRN: [id number] DOB: Feb 17, 1980 Today's Date: 02/25/2023  History of Present Illness  Angel Gray is a 42yoF who comes to [facility] on 02/24/23 after family noticed Left facial droop. Recent (+) flu. PMH: DM2, HTN, HLD. In ED noted left hadn numbness and slurred speech. MRI brain without contrast showed acute infarcts in the right frontal, parietal and temporal lobes and right body of right corpus callosum. IV ceftriaxone was given due to UTI. Also noted hypoalbuminemia due to protein-calorie malnutrition.  Clinical Impression  Pt on phone call upon entry, appears to have been crying, pt reports she is 'sick of being here' and 'really want(s) to go home.' Pt agreeable to assessment, difficulty providing clear reporting when asked about symptoms prior to arrival and comparison to current moment. Pt able to mobilize with minA for hand held assist, but is a little weak to rise and has a few LOB while standing and walking 46ft to transport chair. 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 Unable to assess additional mobility at this time due to pt being taken OTF for CTA. Will advance mobility next date- recommend additional mobility with nursing out of room to fully show ability to safely return to home once medically ready- sounds like mobility was somewhat better controlled with OT assessment an hour later.       If plan is discharge home, recommend the following: A little help with walking and/or transfers;Direct supervision/assist for financial management;Assist for transportation;Assistance with cooking/housework;Help with stairs or ramp for entrance   Can travel by private vehicle   Yes    Equipment Recommendations None recommended by PT  Recommendations for  Other Services       Functional Status Assessment Patient has had a recent decline in their functional status and demonstrates the ability to make significant improvements in function in a reasonable and predictable amount of time.     Precautions / Restrictions Precautions Precautions: Fall Restrictions Weight Bearing Restrictions Per Provider Order: No Other Position/Activity Restrictions: has shoe for L foot wound in room reports she has worn for 2-3 months      Mobility  Bed Mobility Overal bed mobility: Needs Assistance Bed Mobility: Supine to Sit     Supine to sit: Supervision          Transfers Overall transfer level: Needs assistance Equipment used: 1 person hand held assist Transfers: Sit to/from Stand Sit to Stand: Contact guard assist           General transfer comment: from high ED gurney; not very steady, but reportedly feels normal    Ambulation/Gait Ambulation/Gait assistance: Contact guard assist, Min assist Gait Distance (Feet): 8 Feet Assistive device: None, 1 person hand held assist Gait Pattern/deviations: Step-to pattern       General Gait Details: assisted from bedside to transport chair to go for CTA (1 posterior LOB, poor awareness, minA from author to correct)  Stairs            Wheelchair Mobility     Tilt Bed    Modified Rankin (Stroke Patients Only)       Balance  Pertinent Vitals/Pain Pain Assessment Pain Assessment: No/denies pain    Home Living Family/patient expects to be discharged to:: Private residence Living Arrangements: Children (52 year old, 50 year old) Available Help at Discharge: Family Type of Home: Apartment Home Access: Level entry       Home Layout: One level Home Equipment: Agricultural consultant (2 wheels)      Prior Function Prior Level of Function : Independent/Modified Independent;Driving;History of Falls (last six months)              Mobility Comments: no AD use for mobility; has a surgical shoe for L foot d/t wound; 1 fall ast week outside when trying to get to the car in a hurry ADLs Comments: IND     Extremity/Trunk Assessment   Upper Extremity Assessment Upper Extremity Assessment: Left hand dominant;Overall [facility] for tasks assessed    Lower Extremity Assessment Lower Extremity Assessment: Generalized weakness       Communication   Communication Communication: No apparent difficulties  Cognition Arousal: Alert Behavior During Therapy: Lability, Anxious Overall Cognitive Status: Within Functional Limits for tasks assessed                                          General Comments      Exercises     Assessment/Plan    PT Assessment Patient needs continued PT services  PT Problem List Decreased balance;Decreased mobility;Decreased coordination;Decreased activity tolerance       PT Treatment Interventions DME instruction;Gait training;Stair training;Functional mobility training;Therapeutic activities;Therapeutic exercise;Balance training;Neuromuscular re-education;Cognitive remediation;Patient/family education    PT Goals (Current goals can be found in the Care Plan section)  Acute Rehab PT Goals Patient Stated Goal: wants to leavethe hospital as soon as possible. PT Goal Formulation: With patient Time For Goal Achievement: 03/11/23 Potential to Achieve Goals: Good    Frequency 7X/week     Co-evaluation               AM-PAC PT "6 Clicks" Mobility  Outcome Measure Help needed turning from your back to your side while in a flat bed without using bedrails?: A Little Help needed moving from lying on your back to sitting on the side of a flat bed without using bedrails?: A Little Help needed moving to and from a bed to a chair (including a wheelchair)?: A Lot Help needed standing up from a chair using your arms (e.g., wheelchair or bedside chair)?: A Lot Help  needed to walk in hospital room?: A Lot Help needed climbing 3-5 steps with a railing? : A Lot 6 Click Score: 14    End of Session   Activity Tolerance: Patient tolerated treatment well Patient left: in chair;with nursing/sitter in room   PT Visit Diagnosis: Unsteadiness on feet (R26.81);Difficulty in walking, not elsewhere classified (R26.2);Other abnormalities of gait and mobility (R26.89)    Time: 1610-9604 PT Time Calculation (min) (ACUTE ONLY): 11 min   Charges:   PT Evaluation $PT Eval Moderate Complexity: 1 Mod   PT General Charges $$ ACUTE PT VISIT: 1 Visit        4:27 PM, 02/25/23 Rosamaria Lints, PT, DPT Physical Therapist - [facility]  [phone] (ASCOM)    Karee Christopherson C 02/25/2023, 4:23 PM

## 2023-02-25 NOTE — Progress Notes (Signed)
Anticoagulation monitoring(Lovenox):  43 yo female ordered Lovenox 40 mg Q24h    Filed Weights   02/25/23 0420  Weight: 89.3 kg (196 lb 12.8 oz)   BMI 37.2   Lab Results  Component Value Date   CREATININE 0.86 02/24/2023   Estimated Creatinine Clearance: 87.5 mL/min (by C-G formula based on SCr of 0.86 mg/dL). Hemoglobin & Hematocrit     Component Value Date/Time   HGB 12.6 02/24/2023 2123   HCT 38.7 02/24/2023 2123     Per Protocol for Patient with estCrcl > 30 ml/min and BMI > 30, will transition to Lovenox 45 mg Q24h.

## 2023-02-25 NOTE — Hospital Course (Addendum)
Taken from H&P.  Angel Gray is a 43 y.o. female with medical history significant of type 2 diabetes mellitus, hypertension, hyperlipidemia who presents to the emergency department for evaluation of facial droop, slurred speech and left hand numbness.   On presentation patient has significantly elevated blood pressure at 210/83, blood glucose 162, BUN 23, AST 50, ALT 53, ALP 144.  UA positive for UTI, negative UDS. CT head without contrast showed no acute intracranial abnormality MRI brain without contrast showed acute infarcts in the right frontal, parietal and temporal lobes and right body of right corpus callosum. IV ceftriaxone was given due to UTI Teleneurology was consulted and recommended further stroke workup.  2/3: Hemodynamically stable, multiple acute infarcts.  Improving transaminitis, Lipid panel with triglyceride of 168, HDL 27 and LDL of 80, urine cultures pending.  Echocardiogram with normal EF and grade 1 diastolic dysfunction, no other significant abnormality, no septal defect.  Swallow evaluation with mild oral deficit, recommending speech therapy on discharge.  2/4: CTA with very significant multivessel atherosclerotic disease, please see the full report. There was a question of dissection versus extensive thrombosis. Vascular surgery was also consulted, patient has significant vascular disease and concern of fibromuscular dysplasia.  They are recommending angiography which will be arranged as outpatient in 1 to 2 weeks so she can recovered from current stroke.  Patient need to follow-up with a neuro interventional radiologist at [facility] or [facility] for further assistance as she will need quite extensive procedure.  She was also instructed to keep her blood pressure in 130s to 140s for better perfusion of brain due to extensive vascular disease.  There was also concern of involvement of renal vessels as her imaging is way in proportionate than her age and lipid profile. PT is  recommending outpatient physical therapy. Neurology currently recommending DAPT and high intensity statin.  Had a lengthy discussion with patient, her aunt and neurology also discussed with her sister for better assistance and a close follow-up as outpatient to decrease her risk of further stroke.  She is at very high risk for a crippling stroke.  Her urine cultures growing gram-negative rods, she received ceftriaxone in hospital and was given Keflex on discharge.  Her PCP can follow-up the final culture results.  Patient also developed hyperglycemia, BMP was negative for any acidosis or gap, patient uses insulin pump at home which he she will continue.  She was given multiple doses of short acting with resultant improvement in blood glucose level.  She will continue on current medications and need to have a very close follow-up with her providers for further assistance.

## 2023-02-25 NOTE — ED Notes (Signed)
Called CCMD at this time to insure pt was on the board for cardiac monitoring.

## 2023-02-25 NOTE — ED Notes (Addendum)
CCMD called for pt being off the monitor. Pt did got to CT and then had a PT consult. This RN at bedside to get pt back on the monitor. Pt stating "if you put that back on me, I'm going to rip all that shit off." RN explained importance of monitoring equipment with pt dx. Pt still refusing at this time. MD made aware.

## 2023-02-25 NOTE — ED Notes (Signed)
Pt on the phone with family member. Pt became upset over conversation about her car and getting home. Pt currently crying at bedside to family member on the phone. Pt stating she's "tired of this shit." And "I get fucked over every time I come to the hospital." RN is unsure if pt is talking about not having a car or her health. No clarification from pt at this time.

## 2023-02-25 NOTE — ED Notes (Signed)
CCMD made aware that pt had pulled off all monitoring equipment and will not allow this RN to put it back on.

## 2023-02-25 NOTE — ED Notes (Signed)
 Pt refusing vital signs at this time.

## 2023-02-25 NOTE — ED Notes (Signed)
Pt expressing concerns for her apartment and job. Pt states that she needs to work and in order to pay her rent so she doesn't get kicked out. This RN tried to reassure pt at this time.

## 2023-02-25 NOTE — H&P (Signed)
History and Physical    Patient: Angel Gray ZOX:[id number] DOB: 03-16-1980 DOA: 02/24/2023 DOS: the patient was seen and examined on 02/25/2023 PCP: Patient, No Pcp Per  Patient coming from: Home  Chief Complaint:  Chief Complaint  Patient presents with   Facial Droop   HPI: Angel Gray is a 42 y.o. female with medical history significant of type 2 diabetes mellitus, hypertension, hyperlipidemia who presents to the emergency department for evaluation of facial droop.  Patient was on phone with her sister noted a left facial droop.  She complained of 1 week of flu symptoms with generalized weakness.  She has slurred speech which patient feels that new, she also complained of left hand numbness.  Last well-known time was unknown.  ED Course:  In the emergency department, BP on arrival was 210/83, but other vital signs were within normal range.  Workup in the ED showed normal CBC and BMP except for blood glucose of 162, BUN 23, albumin 3.3, AST 50, ALT 53, ALP 144.  Urinalysis was positive for UTI, urine drug screen was normal. CT head without contrast showed no acute intracranial abnormality MRI brain without contrast showed acute infarcts in the right frontal, parietal and temporal lobes and right body of right corpus callosum. IV ceftriaxone was given due to UTI Teleneurology was consulted and recommended further stroke workup. Hospitalist was asked admit patient for further evaluation and management.  Review of Systems: Review of systems as noted in the HPI. All other systems reviewed and are negative.   Past Medical History:  Diagnosis Date   Diabetes mellitus without complication (HCC)    Past Surgical History:  Procedure Laterality Date   TOE AMPUTATION      Social History:  reports that she has never smoked. She has never used smokeless tobacco. She reports that she does not currently use alcohol. No history on file for drug use.   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 No pertinent family history.   Prior to Admission medications   Medication Sig Start Date End Date Taking? Authorizing Provider  hydrochlorothiazide (HYDRODIURIL) 25 MG tablet Take 25 mg by mouth daily. 10/13/22  Yes [provider]  insulin lispro (HUMALOG) 100 UNIT/ML injection Inject 100 Units into the skin as directed. 100 units per day loaded into insulin pump. 06/12/22  Yes [provider]  rosuvastatin (CRESTOR) 10 MG tablet Take 10 mg by mouth at bedtime. 02/06/23  Yes [provider]  TOUJEO SOLOSTAR 300 UNIT/ML Solostar Pen Inject 36 Units into the skin daily. 01/01/23  Yes [provider]  valsartan (DIOVAN) 320 MG tablet Take 320 mg by mouth daily. 12/12/22  Yes [provider]    Physical Exam: BP (!) 165/82   Pulse 81   Temp 97.7 F (36.5 C) (Oral)   Resp 14   Ht 5\' 1"  (1.549 m)   SpO2 96%   BMI 34.01 kg/m   General: 43 y.o. year-old female well developed well nourished in no acute distress.  Alert and oriented x3. HEENT: NCAT, EOMI Neck: Supple, trachea medial Cardiovascular: Regular rate and rhythm with no rubs or gallops.  No thyromegaly or JVD noted.  No lower extremity edema. 2/4 pulses in all 4 extremities.  Respiratory: Clear to auscultation with no wheezes or rales. Good inspiratory effort. Abdomen: Soft, nontender nondistended with normal bowel sounds x4 quadrants. Muskuloskeletal: No cyanosis, clubbing or edema noted bilaterally Neuro: CN II-XII intact, strength 5/5 x 4, sensation, reflexes intact Skin: No ulcerative lesions noted or rashes Psychiatry: Judgement and insight appear normal. Mood is appropriate for condition and setting          Labs on Admission:  Basic Metabolic  Panel: Recent Labs  Lab 02/24/23 2123  NA 138  K 5.1  CL 104  CO2 23  GLUCOSE 162*  BUN 23*  CREATININE 0.86  CALCIUM 8.8*   Liver Function Tests: Recent Labs  Lab 02/24/23 2123  AST 50*  ALT 53*  ALKPHOS 144*  BILITOT 0.6  PROT 7.8  ALBUMIN 3.3*   No results for input(s): "LIPASE", "AMYLASE" in the last 168 hours. No results for input(s): "AMMONIA" in the last 168 hours. CBC: Recent Labs  Lab 02/24/23 2123  WBC 7.7  NEUTROABS 3.1  HGB 12.6  HCT 38.7  MCV 83.2  PLT 220   Cardiac Enzymes: No results for input(s): "CKTOTAL", "CKMB", "CKMBINDEX", "TROPONINI" in the last 168 hours.  BNP (last 3 results) No results for input(s): "BNP" in the last 8760 hours.  ProBNP (last 3 results) No results for input(s): "PROBNP" in the last 8760 hours.  CBG: Recent Labs  Lab 02/24/23 2121  GLUCAP 153*    Radiological Exams on Admission: MR BRAIN WO CONTRAST Result Date: 02/24/2023 CLINICAL DATA:  eval cva. slurred speech and facial droop EXAM: MRI HEAD WITHOUT CONTRAST TECHNIQUE: Multiplanar, multiecho pulse sequences of the brain and surrounding structures were obtained without intravenous contrast. COMPARISON:  CT head. FINDINGS: Brain: Multiple small acute infarcts in the right frontal lobe and right parietal lobe. Additional small acute infarct in the medial right temporal lobe and infarct in the body of the corpus callosum. Edema without substantial mass effect. Remote left frontal cortical infarct. Mild to moderate scattered T2/FLAIR hyperintensities not matter compatible with chronic microvascular disease. Vascular: Major arterial flow voids are maintained. Skull and upper cervical spine: Normal marrow signal. Sinuses/Orbits: Moderate paranasal sinus mucosal thickening. No acute orbital findings. Other: No sizable mastoid effusions. 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 Electronically Signed    By: Feliberto Harts M.D.   On: 02/24/2023 23:21   CT HEAD CODE STROKE WO CONTRAST Result Date: 02/24/2023 CLINICAL DATA:  Code stroke.  Slurred speech and facial droop EXAM: CT HEAD WITHOUT CONTRAST TECHNIQUE: Contiguous axial images were obtained from the base of the skull through the vertex without intravenous contrast. RADIATION DOSE REDUCTION: This exam was performed according to the departmental dose-optimization program which includes automated exposure control, adjustment of the mA and/or kV according to patient size and/or use of iterative reconstruction technique. COMPARISON:  None Available. FINDINGS: Brain: There is no mass, hemorrhage or extra-axial collection. The size and configuration of the ventricles and extra-axial CSF spaces are normal. The brain parenchyma is normal, without evidence of acute or chronic infarction. Incidental partially empty sella turcica. Vascular: No abnormal hyperdensity of the major intracranial arteries or dural venous sinuses. No intracranial atherosclerosis. Skull: The visualized skull base, calvarium and extracranial soft tissues are normal. Sinuses/Orbits: No fluid levels or advanced mucosal thickening of the visualized paranasal sinuses. No mastoid or middle ear effusion. The orbits are normal. ASPECTS [facility] Stroke Program Early CT Score) - Ganglionic level infarction (caudate, lentiform  nuclei, internal capsule, insula, M1-M3 cortex): 7 - Supraganglionic infarction (M4-M6 cortex): 3 Total score (0-10 with 10 being normal): 10 IMPRESSION: 1. No acute intracranial abnormality. 2. ASPECTS is 10. These results were called by telephone at the time of interpretation on 02/24/2023 at 9:44 pm to provider [facility] , who verbally acknowledged these results. Electronically Signed   By: Deatra Robinson M.D.   On: 02/24/2023 21:44    EKG: I independently viewed the EKG done and my findings are as followed: Normal sinus rhythm at a rate of 93  bpm  Assessment/Plan Present on Admission:  Acute ischemic stroke [facility])  Principal Problem:   Acute ischemic stroke (HCC) Active Problems:   UTI (urinary tract infection)   Hypoalbuminemia due to protein-calorie malnutrition (HCC)   Transaminitis   Essential hypertension   Mixed hyperlipidemia   Type 2 diabetes mellitus with hyperglycemia (HCC)  Acute ischemic stroke Patient will be admitted to telemetry unit CT head without contrast showed no acute intracranial abnormality MRI brain without contrast showed acute infarcts in the right frontal, parietal and temporal lobes and right body of right corpus callosum. Echocardiogram in the morning Continue aspirin 81 mg p.o. daily Continue fall precautions and neuro checks Lipid panel and hemoglobin A1c will be checked Continue PT/SLP/OT eval and treat Bedside swallow eval by nursing prior to diet Consider neurology consult in the morning  UTI POA Urinalysis positive for UTI Patient was started on IV ceftriaxone, we shall continue same at this time Urine culture pending  Transaminitis AST 50, ALT 53, ALP 144 Patient denies any abdominal pain Continue to monitor liver enzymes  Hypoalbuminemia Albumin 3.3, continue protein supplement  Essential hypertension  Antihypertensives PRN if Blood pressure is greater than 220/120 or there is a concern for End organ damage/contraindications for permissive HTN. If blood pressure is greater than 220/120 give labetalol PO or IV or Vasotec IV with a goal of 15% reduction in BP during the first 24 hours.  Mixed hyperlipidemia Statin will be temporarily held due to elevated liver enzymes  Type 2 diabetes mellitus with hyperglycemia Continue ISS and hypoglycemia protocol  DVT prophylaxis: Lovenox  Code Status: Full code  Family Communication: None at bedside  Consults: None   Severity of Illness: The appropriate patient status for this patient is OBSERVATION. Observation status is  judged to be reasonable and necessary in order to provide the required intensity of service to ensure the patient's safety. The patient's presenting symptoms, physical exam findings, and initial radiographic and laboratory data in the context of their medical condition is felt to place them at decreased risk for further clinical deterioration. Furthermore, it is anticipated that the patient will be medically stable for discharge from the hospital within 2 midnights of admission.   Author: Frankey Shown, DO 02/25/2023 2:52 AM  For on call review [email or web address].

## 2023-02-25 NOTE — ED Notes (Signed)
Answered call light, pt states she has pain in her upper belly since her IV was put in. RN notified

## 2023-02-25 NOTE — Evaluation (Signed)
Clinical/Bedside Swallow and Motor Speech Evaluation Patient Details  Name: Angel Gray MRN: [id number] Date of Birth: [birth year]  Today's Date: 02/25/2023 Time: SLP Start Time (ACUTE ONLY): 1115 SLP Stop Time (ACUTE ONLY): 1145 SLP Time Calculation (min) (ACUTE ONLY): 30 min  Past Medical History:  Past Medical History:  Diagnosis Date   Diabetes mellitus without complication (HCC)    Past Surgical History:  Past Surgical History:  Procedure Laterality Date   TOE AMPUTATION     HPI:  Per H&P: "Angel Gray is a 43 y.o. female with medical history significant of type 2 diabetes mellitus, hypertension, hyperlipidemia who presents to the emergency department for evaluation of facial droop.  Patient was on phone with her sister noted a left facial droop.  She complained of 1 week of flu symptoms with generalized weakness.  She has slurred speech which patient feels that new, she also complained of left hand numbness.  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 Pt is currently on a regular solids diet following nurse swallow screen.    Assessment / Plan / Recommendation  Clinical Impression  Pt seen for bedside swallow assessment in the setting of acute CVA with left facial weakness. Pt on room air and regular diet following completion of swallow screen. Dry cough noted in the absence of PO intake- persisting throughout session with pt reporting minimal relief from cough in the last 24 hours. Pt requested medication for throat- RN notified. Pt seen with trials of thin liquids and regular solids. Pt able to self feed with set up only. No overt or subtle s/sx pharyngeal dysphagia noted. No change to vocal quality across trials. Vitals stable for duration of trials. Mild oral deficits noted secondary to facial/labial weakness with extended time and liquid wash utilized for oral  clearance.       Based on oral weakness/oral phase deficits, recommend aspiration precautions (slow rate, small bites, elevated HOB, and alert for PO intake). Continue with regular solids (with cut meats, moistened solids) and thin liquids.  Intermittent supervision for compliance with precautions.       Regarding motor speech mild-moderate dysarthria noted, c/b slow rate and imprecise articulation. Intelligibility reduced at conversational level, though single word utterances in repetition were intact. Pt endorsed awareness with difference in speech compared to baseline. Pt alert, oriented x4, and sustaining attention for duration of session. Functional recall demonstrated with identification of complication with current IV. Intact expressive/receptive language. Pt denied change to cognition, though slowed processing detected. Education shared regarding impact of oral weakness on speech/oral phase of swallowing, with pt reporting understanding. Recommend follow up higher level cognitive assessment and motor speech intervention at next level of care. SLP Visit Diagnosis: Dysarthria and anarthria (R47.1);Dysphagia, oral phase (R13.11)    Aspiration Risk  Mild aspiration risk    Diet Recommendation   Thin;Age appropriate regular  Medication Administration: Whole meds with liquid    Other  Recommendations Oral Care Recommendations: Oral care BID    Recommendations for follow up therapy are one component of a multi-disciplinary discharge planning process, led by the attending physician.  Recommendations may be updated based on patient status, additional functional criteria and insurance authorization.  Follow up Recommendations Follow physician's recommendations for discharge plan and follow up therapies      Assistance Recommended at Discharge    Functional Status Assessment Patient has had a recent decline in  their functional status and demonstrates the ability to make significant improvements  in function in a reasonable and predictable amount of time.  Frequency and Duration            Prognosis Prognosis for improved oropharyngeal function: Good Barriers to Reach Goals:  (none)      Swallow Study   General Date of Onset: 02/25/23 HPI: Per H&P: "Angel Gray is a 43 y.o. female with medical history significant of type 2 diabetes mellitus, hypertension, hyperlipidemia who presents to the emergency department for evaluation of facial droop.  Patient was on phone with her sister noted a left facial droop.  She complained of 1 week of flu symptoms with generalized weakness.  She has slurred speech which patient feels that new, she also complained of left hand numbness.  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 Pt is currently on a regular solids diet following nurse swallow screen. Type of Study: Bedside Swallow Evaluation Previous Swallow Assessment: none Diet Prior to this Study: Regular;Thin liquids (Level 0) Temperature Spikes Noted: No (WBC 7.7) Respiratory Status: Room air History of Recent Intubation: No Behavior/Cognition: Alert;Cooperative Oral Cavity Assessment: Within Functional Limits Oral Care Completed by SLP: Recent completion by staff Oral Cavity - Dentition: Adequate natural dentition Vision: Functional for self-feeding Self-Feeding Abilities: Able to feed self;Needs set up Patient Positioning: Upright in bed Baseline Vocal Quality: Normal Volitional Cough: Strong (persistent t/o session) Volitional Swallow: Able to elicit    Oral/Motor/Sensory Function Overall Oral Motor/Sensory Function: Mild impairment Facial ROM: Reduced left Facial Symmetry: Abnormal symmetry left Facial Strength: Reduced left Facial Sensation: Reduced left Lingual ROM: Within Functional Limits Lingual Symmetry: Within Functional Limits Lingual Strength: Within  Functional Limits Velum: Within Functional Limits Mandible: Within Functional Limits   Ice Chips Ice chips: Not tested   Thin Liquid Thin Liquid: Within functional limits    Nectar Thick Nectar Thick Liquid: Not tested   Honey Thick Honey Thick Liquid: Not tested   Puree Puree: Not tested   Solid     Solid: Within functional limits     [place] Raahil Ong Clapp  MS [facility] SLP   [place] J Clapp 02/25/2023,11:53 AM

## 2023-02-25 NOTE — Consult Note (Signed)
NEUROLOGY CONSULT NOTE   Date of service: February 25, 2023 Patient Name: Angel Gray MRN:  [id number] DOB:  [birth year] Chief Complaint: "Left facial droop, generalized weakness" Requesting Provider: Arnetha Courser, MD  History of Present Illness  Angel Gray is a 43 y.o. 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   History extremely limited by patient anxiety, tearfulness, perseveration on wanting to go home.  Family notes that they have not been as much in touch as usual due to upper respiratory infections (trying to isolate to avoid infection spread)  In some notes patient denied any falls but family reports she has been falling more for the past week at least 3-4 times. Has also had urinary incontinence for ~2 weeks per family which patient denies.  They have not seen her for many days but did note a new left facial droop when face timing with her yesterday, resulting in MRI brain being obtained which demonstrated acute stroke for which neurology was consulted.  Family does report the patient is emotionally labile at baseline.  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   Missed last foot wound appointment due to weather   Does take her insulin and blood pressure medications   LKW: 1 week prior to admission Modified rankin score: 0-1 secondary to toe amputations IV Thrombolysis: No, out of the window EVT: No, exam not consistent with LVO   NIHSS components Score: Comment  1a Level of Conscious 0[x]  1[]  2[]  3[]      1b LOC Questions 0[x]  1[]  2[]       1c LOC Commands 0[x]  1[]  2[]       2 Best Gaze 0[x]  1[]  2[]       3  Visual 0[x]  1[]  2[]  3[]      4 Facial Palsy 0[]  1[]  2[x]  3[]      5a Motor Arm - left 0[]  1[x]  2[]  3[]  4[]  UN[]    5b Motor Arm - Right 0[x]  1[]  2[]  3[]  4[]  UN[]    6a Motor Leg - Left 0[]  1[x]  2[]  3[]  4[]  UN[]    6b Motor Leg - Right 0[x]  1[]  2[]  3[]  4[]  UN[]    7 Limb Ataxia 0[]  1[x]  2[]  3[]  UN[]   Left lower extremity appears out of proportion to drift  8 Sensory 0[x]  1[]  2[]  UN[]      9 Best Language 0[x]  1[]  2[]  3[]      10 Dysarthria 0[]  1[x]  2[]  UN[]      11 Extinct. and Inattention 0[x]  1[]  2[]       TOTAL:       ROS  Limited by anxiety and patient's tendency to minimize symptoms.  Past History   Past Medical History:  Diagnosis Date   Diabetes mellitus without complication (HCC)     Past Surgical History:  Procedure Laterality Date   TOE AMPUTATION      Family History: Mom died young, COPD, heart attack (first in her 30s), heart failure Maternal grandmother died in 60s of heart failure Paternal grandmother also passed away Father with brain injury secondary to falls / alcoholism   Social History  reports that she has never smoked. She has never used smokeless tobacco.  She reports that she does not currently use alcohol. No history on file for drug use.  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   Ondansetron Hcl Rash    Medications   Current Facility-Administered Medications:     stroke: early stages of recovery book, , Does not apply, Once, Arnetha Courser, MD   acetaminophen (TYLENOL) tablet 650 mg, 650 mg, Oral, Q6H PRN **OR** acetaminophen (TYLENOL) suppository 650 mg, 650 mg, Rectal, Q6H PRN, Adefeso, Oladapo, DO   aspirin EC tablet 81 mg, 81 mg, Oral, Daily, Adefeso, Oladapo, DO   cefTRIAXone (ROCEPHIN) 1 g in sodium chloride 0.9 % 100 mL IVPB, 1 g, Intravenous, Q24H, Adefeso,  Oladapo, DO   enoxaparin (LOVENOX) injection 45 mg, 0.5 mg/kg, Subcutaneous, Q24H, Adefeso, Oladapo, DO   feeding supplement (GLUCERNA SHAKE) (GLUCERNA SHAKE) liquid 237 mL, 237 mL, Oral, TID BM, Adefeso, Oladapo, DO   insulin aspart (novoLOG) injection 0-15 Units, 0-15 Units, Subcutaneous, TID WC, Adefeso, Oladapo, DO, 5 Units at 02/25/23 0740   prochlorperazine (COMPAZINE) injection 10 mg, 10 mg, Intravenous, Q6H PRN, Adefeso, Oladapo, DO  Current Outpatient Medications:    hydrochlorothiazide (HYDRODIURIL) 25 MG tablet, Take 25 mg by mouth daily., Disp: , Rfl:    insulin lispro (HUMALOG) 100 UNIT/ML injection, Inject 100 Units into the skin as directed. 100 units per day loaded into insulin pump., Disp: , Rfl:    rosuvastatin (CRESTOR) 10 MG tablet, Take 10 mg by mouth at bedtime., Disp: , Rfl:    TOUJEO SOLOSTAR 300 UNIT/ML Solostar Pen, Inject 36 Units into the skin daily., Disp: , Rfl:    valsartan (DIOVAN) 320 MG tablet, Take 320 mg by mouth daily., Disp: , Rfl:   Vitals   Vitals:   02/25/23 0609 02/25/23 0630 02/25/23 0700 02/25/23 0730  BP: (!) 145/85 (!) 121/57 (!) 112/52 (!) 145/71  Pulse: 95 81 76 80  Resp: (!) 22 (!) 23 (!) 22 13  Temp:      TempSrc:      SpO2: 100% 96% 95% 96%  Weight:      Height:        Body mass index is 37.19 kg/m.  Physical Exam   Constitutional: Appears highly anxious Psych: Emotionally labile, tearful, irritable, redirectable, intermittently cooperative HENT: No OP obstruction.  No scleral injection, normocephalic Cardiovascular: Normal rate and regular rhythm.  Respiratory: Effort normal, non-labored breathing.  GI: Soft.  No distension. There is no tenderness.  Skin: Left foot boot in place due to wound  Neurologic Examination   Neuro: Mental Status: Patient is awake, alert, oriented to person, place, month, year, and situation. Patient gives very limited history due to anxiety/perseveration on wanting to go home, but is able to name  and repeat.  Very subtle left-sided neglect (grabs slightly towards the right when asked to grab an long object held horizontally in front of her in the middle) Cranial Nerves: II: Visual Fields are full. Pupils are equal, round, and reactive to light.   III,IV, VI: EOMI without ptosis or diploplia.  Mild intermittent disconjugate gaze hard to characterize V: Facial sensation is symmetric to temperature and light touch VII: Facial movement is notable for left facial droop.  VIII: hearing is intact to voice X: Uvula elevates symmetrically XI: Shoulder shrug is symmetric. XII: tongue is midline  without atrophy or fasciculations.  Motor: Mild left hemiparesis, arm greater than leg in an upper motor neuron pattern.  Full strength on the right side Sensory: Sensation is symmetric to light touch and temperature in the arms and legs. Cerebellar: FNF intact bilaterally but heel-to-shin seems ataxic in the left lower extremity   Labs/Imaging/Neurodiagnostic studies   CBC:  Recent Labs  Lab 03-22-2023 2123  WBC 7.7  NEUTROABS 3.1  HGB 12.6  HCT 38.7  MCV 83.2  PLT 220   Basic Metabolic Panel:  Lab Results  Component Value Date   NA 137 02/25/2023   K 4.9 02/25/2023   CO2 22 02/25/2023   GLUCOSE 245 (H) 02/25/2023   BUN 21 (H) 02/25/2023   CREATININE 0.75 02/25/2023   CALCIUM 8.3 (L) 02/25/2023   GFRNONAA >60 02/25/2023   Lipid Panel:  Lab Results  Component Value Date   CHOL 141 02/25/2023   HDL 27 (L) 02/25/2023   LDLCALC 80 02/25/2023   TRIG 168 (H) 02/25/2023   CHOLHDL 5.2 02/25/2023    HgbA1c: No results found for: "HGBA1C"  Urine Drug Screen:     Component Value Date/Time   LABOPIA NONE DETECTED 03-22-2023 2205   COCAINSCRNUR NONE DETECTED 2023/03/22 2205   LABBENZ NONE DETECTED 03-22-2023 2205   AMPHETMU NONE DETECTED 2023/03/22 2205   THCU NONE DETECTED 2023/03/22 2205   LABBARB NONE DETECTED March 22, 2023 2205    Alcohol Level     Component Value Date/Time    ETH <10 03/22/2023 2123   INR  Lab Results  Component Value Date   INR 0.9 03-22-23   APTT  Lab Results  Component Value Date   APTT 25 03/22/2023     CT angio Head and Neck with contrast(Personally reviewed): 1. Near complete nonopacification of the right ICA just distal to its origin, with an appearance concerning for dissection versus extensive thrombosis. Minimal flow is seen throughout the remainder of the cervical ICA, with reconstitution in the mid supraclinoid ICA, just distal to an area of focal calcification. 2. At least 75% narrowing at the origin of the left ICA, similarly concerning for thrombosis versus dissection, with irregularity in the proximal left ICA and possible linear filling defect in the mid left ICA, which could also represent dissection. Moderate narrowing in the left cavernous and supraclinoid ICA. 3. Severe narrowing at the origin of the left vertebral artery and in the left V1. Moderate stenosis in the right V1. 4. Severe narrowing at the origin of the bilateral ECAs, with additional multifocal narrowing in 1 of the right ECA branches. 5. Hypodensity in the right body of the corpus callosum, best seen on the coronal images, which correlates with the acute infarct noted on the 03-22-2023 MRI. Additional smaller foci of acute infarct in the periventricular white matter, right temporal lobe, and right parietal lobe are not well seen on this exam, which may be due to motion.  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 Remote left frontal infarct and scattered UBO  ECHO  1. Left ventricular ejection fraction, by estimation, is 55 to 60%. The  left ventricle has normal function. The left ventricle has no regional  wall motion abnormalities. Left ventricular diastolic parameters are  consistent with Grade I diastolic  dysfunction (impaired  relaxation).   2. Right ventricular systolic function is normal. The right ventricular  size is normal.   3. The mitral valve is normal in  structure. No evidence of mitral valve  regurgitation. No evidence of mitral stenosis.   4. The aortic valve is normal in structure. Aortic valve regurgitation is  not visualized. No aortic stenosis is present.   5. The inferior vena cava is normal in size with greater than 50%  respiratory variability, suggesting right atrial pressure of 3 mmHg.   6. The interatrial septum is aneurysmal. No atrial level shunt detected  by color flow Doppler.  [Normal biatrial sizes]  ASSESSMENT   Angel Gray is a 43 y.o. 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 Tommie Sams.  Given her history of amputations and her multifocal vessel disease, as well as overall appearance, vessel imaging findings are felt to represent advanced atherosclerotic disease, unlikely to be dissection.  Additionally no data to guide anticoagulation versus dual antiplatelet therapy for dissections in the early time period -- with noninferiority of antiplatelets after the acute phase.  As per family she has been having falls for at least 1 week so is likely at least 1 week out from onset of this process. Will hold off on anticoagulation and utilize DAPT.   Regarding blood pressure goal, consider did that she may need slightly higher level of permissive hypertension for a longer period of time due to her severe right carotid disease.  However she seems to have been tolerating normotension today without worsening of her symptoms.   I do think observation is appropriate to confirm stability of her symptoms, however care is complicated by her refusal of monitoring and medications  RECOMMENDATIONS  -Started on 75 mg Plavix daily by primary team today, will give additional 225 mg to achieve full 300 mg loading dose of Plavix (note patient has refused the dose per nursing  documentation) -Continue dual antiplatelet therapy for at least 3 months -Outpatient referral to neurointerventional radiology for consideration of stenting, would ideally wait approximately 1 week from now to reduce risk of further stroke from her current clot burden but needs urgent referral due to severity of disease -Goal normotension given she seems to be tolerating normotension without worsening of her symptoms despite severe carotid stenosis.  However if she develops worsening symptoms needs to lay flat, elevate the legs and reactivate code stroke for consideration of more emergent intervention -Resume statin when cleared from medicine perspective. Consider addition of PSK-9 inhibitor for goal LDL < 70 -Appreciate management of co-morbidities per primary team -Sister's contact information added to the chart, patient agreed to allow me to speak to her sister as well Debria Garret)  ______________________________________________________________________   Brooke Dare MD-PhD [facility] [phone] [facility] coverage for [facility] is from 8 AM to 4 AM in-house and 4 PM to 8 PM by telephone/video. 8 PM to 8 AM emergent questions or overnight urgent questions should be addressed to Teleneurology On-call or [facility] neurohospitalist; contact information can be found on AMION

## 2023-02-25 NOTE — Evaluation (Signed)
Occupational Therapy Evaluation Patient Details Name: Angel Gray MRN: [id number] DOB: Apr 21, 1980 Today's Date: 02/25/2023   History of Present Illness 43 y.o. female who presents to the emergency department for evaluation of facial droop, slurred speech and left hand numbness as well as 1 week of flu symptoms with generalized weakness. 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 PMHx: DM2, HTN, HLD   Clinical Impression   Pt was seen for OT evaluation this date. Prior to hospital admission, pt was living in a ground level apartment with her 76 and 32 year old kids. Report IND at baseline with all tasks, mobility, driving her kids to school and working 3 days/week as a Conservation officer, nature at [employer] where they allow her to sit on a stool 2/2 her L foot wound.   Pt presents to acute OT demonstrating impaired ADL performance and functional mobility 2/2 weakness, safety awareness deficits, and balance deficits (See OT problem list for additional functional deficits). BUE ROM and strength intact 4+/5. Sensation mildly affected on L hand, can feel touch, but states it does not feel like it used to. FMC/GMC intact. Visual tracking not as smooth, but no visual deficits notable. Pt currently requires SUP for bed mobility. She is emotional throughout session stating "I want to go home, I have to pay my bills and go back to work. My kid won't take the bus, so I have to drive them to school." Able to perform STS and mobility in the room with no AD use and CGA. CGA/SBA for toilet transfer, peri-care and LB dressing tasks seated/standing at toilet. Oriented x4, able to recall 2/3 words after ~55min.  Pt would benefit from skilled OT services to address noted impairments and functional limitations (see below for any additional details) in order to maximize safety and independence while minimizing falls risk and caregiver burden. Do anticipate the need  for follow up OT services upon acute hospital DC.        If plan is discharge home, recommend the following: A little help with walking and/or transfers;A little help with bathing/dressing/bathroom;Help with stairs or ramp for entrance;Assist for transportation;Assistance with cooking/housework    Functional Status Assessment  Patient has had a recent decline in their functional status and demonstrates the ability to make significant improvements in function in a reasonable and predictable amount of time.  Equipment Recommendations  Tub/shower Gray    Recommendations for Other Services       Precautions / Restrictions Precautions Precautions: Fall Restrictions Weight Bearing Restrictions Per Provider Order: No Other Position/Activity Restrictions: has shoe for L foot wound in room reports she has worn for 2-3 months      Mobility Bed Mobility Overal bed mobility: Needs Assistance Bed Mobility: Supine to Sit, Sit to Supine     Supine to sit: Supervision Sit to supine: Supervision        Transfers Overall transfer level: Needs assistance Equipment used: None Transfers: Sit to/from Stand Sit to Stand: Supervision, Contact guard assist           General transfer comment: CGA to SUP for STS and in room mobility in ED to toilet, door and back with x1 posterior LOB with return to bed      Balance Overall balance assessment: Needs assistance   Sitting balance-Leahy Scale: Good       Standing balance-Leahy Scale: Fair Standing balance comment: reaches for grab bar during transfers; UE support  on EOB to maintain balance at times                           ADL either performed or assessed with clinical judgement   ADL Overall ADL's : Needs assistance/impaired                     Lower Body Dressing: Supervision/safety;Sit to/from stand;Sitting/lateral leans;Contact guard assist Lower Body Dressing Details (indicate cue type and reason): CGA to  SBA for balance with LB dressing Toilet Transfer: Supervision/safety;Grab bars Toilet Transfer Details (indicate cue type and reason): SBA with use of grab bar Toileting- Clothing Manipulation and Hygiene: Supervision/safety Toileting - Clothing Manipulation Details (indicate cue type and reason): to perform peri-care/washing in standing at toilet             Vision   Vision Assessment?: Yes Tracking/Visual Pursuits: Decreased smoothness of horizontal tracking;Decreased smoothness of vertical tracking (blinks with change of fields with revert to midline then back to stimulus being tracked)     Perception         Praxis         Pertinent Vitals/Pain Pain Assessment Pain Assessment: No/denies pain     Extremity/Trunk Assessment Upper Extremity Assessment Upper Extremity Assessment: Left hand dominant;Overall [facility] for tasks assessed   Lower Extremity Assessment Lower Extremity Assessment: Generalized weakness       Communication Communication Communication: No apparent difficulties   Cognition Arousal: Alert Behavior During Therapy: WFL for tasks assessed/performed Overall Cognitive Status: Within Functional Limits for tasks assessed                                 General Comments: oriented x4     General Comments       Exercises Other Exercises Other Exercises: Edu on role of OT in acute setting and importance of therapy to maximize strength/safety/IND.   Shoulder Instructions      Home Living Family/patient expects to be discharged to:: Private residence Living Arrangements: Children (74 year old, 72 year old) Available Help at Discharge: Family Type of Home: Apartment Home Access: Level entry     Home Layout: One level     Bathroom Shower/Tub: Chief Strategy Officer: Standard     Home Equipment: Agricultural consultant (2 wheels)          Prior Functioning/Environment Prior Level of Function : Independent/Modified  Independent;Driving;History of Falls (last six months)             Mobility Comments: no AD use for mobility; has a surgical shoe for L foot d/t wound; 1 fall ast week outside when trying to get to the car in a hurry ADLs Comments: IND        OT Problem List: Decreased strength;Decreased safety awareness;Impaired balance (sitting and/or standing)      OT Treatment/Interventions: Self-care/ADL training;Therapeutic exercise;Therapeutic activities;DME and/or AE instruction;Patient/family education;Balance training    OT Goals(Current goals can be found in the care plan section) Acute Rehab OT Goals Patient Stated Goal: go home OT Goal Formulation: With patient Time For Goal Achievement: 03/11/23 Potential to Achieve Goals: Good ADL Goals Pt Will Perform Lower Body Bathing: with modified independence;sitting/lateral leans;sit to/from stand Pt Will Perform Lower Body Dressing: with modified independence;sitting/lateral leans;sit to/from stand Pt Will Transfer to Toilet: with modified independence;regular height toilet;grab bars;ambulating Pt Will Perform Toileting - Clothing Manipulation and  hygiene: with modified independence;sit to/from stand;sitting/lateral leans  OT Frequency: Min 1X/week    Co-evaluation              AM-PAC OT "6 Clicks" Daily Activity     Outcome Measure Help from another person eating meals?: None Help from another person taking care of personal grooming?: None Help from another person toileting, which includes using toliet, bedpan, or urinal?: A Little Help from another person bathing (including washing, rinsing, drying)?: A Little Help from another person to put on and taking off regular upper body clothing?: None Help from another person to put on and taking off regular lower body clothing?: A Little 6 Click Score: 21   End of Session Nurse Communication: Mobility status  Activity Tolerance: Patient tolerated treatment well Patient left: in  bed;with call bell/phone within reach;with nursing/sitter in room  OT Visit Diagnosis: Other abnormalities of gait and mobility (R26.89);Unsteadiness on feet (R26.81);Muscle weakness (generalized) (M62.81)                Time: 9528-4132 OT Time Calculation (min): 31 min Charges:  OT General Charges $OT Visit: 1 Visit OT Evaluation $OT Eval Moderate Complexity: 1 Mod OT Treatments $Self Care/Home Management : 8-22 mins Farzad Tibbetts, OTR/L 02/25/23, 2:44 PM  Sherlonda Flater E Endya Austin 02/25/2023, 2:39 PM

## 2023-02-25 NOTE — ED Notes (Signed)
Went into introduce self to patient, patient currently on phone, tearful and upset, refusing to be placed on cardiac monitoring. This RN explained the importance of being on monitoring however patient still refusing. Allowed this RN to update v/s.

## 2023-02-26 DIAGNOSIS — I6523 Occlusion and stenosis of bilateral carotid arteries: Secondary | ICD-10-CM

## 2023-02-26 DIAGNOSIS — R4781 Slurred speech: Secondary | ICD-10-CM | POA: Diagnosis not present

## 2023-02-26 DIAGNOSIS — Z794 Long term (current) use of insulin: Secondary | ICD-10-CM

## 2023-02-26 DIAGNOSIS — I639 Cerebral infarction, unspecified: Secondary | ICD-10-CM | POA: Diagnosis not present

## 2023-02-26 DIAGNOSIS — R29810 Facial weakness: Secondary | ICD-10-CM

## 2023-02-26 DIAGNOSIS — N3000 Acute cystitis without hematuria: Secondary | ICD-10-CM | POA: Diagnosis not present

## 2023-02-26 LAB — CBC
HCT: 36.9 % (ref 36.0–46.0)
Hemoglobin: 11.7 g/dL — ABNORMAL LOW (ref 12.0–15.0)
MCH: 26.4 pg (ref 26.0–34.0)
MCHC: 31.7 g/dL (ref 30.0–36.0)
MCV: 83.1 fL (ref 80.0–100.0)
Platelets: 262 10*3/uL (ref 150–400)
RBC: 4.44 MIL/uL (ref 3.87–5.11)
RDW: 13.7 % (ref 11.5–15.5)
WBC: 9.3 10*3/uL (ref 4.0–10.5)
nRBC: 0 % (ref 0.0–0.2)

## 2023-02-26 LAB — BASIC METABOLIC PANEL
Anion gap: 11 (ref 5–15)
BUN: 26 mg/dL — ABNORMAL HIGH (ref 6–20)
CO2: 24 mmol/L (ref 22–32)
Calcium: 8.7 mg/dL — ABNORMAL LOW (ref 8.9–10.3)
Chloride: 98 mmol/L (ref 98–111)
Creatinine, Ser: 0.9 mg/dL (ref 0.44–1.00)
GFR, Estimated: >60 mL/min (ref >60)
Glucose, Bld: 402 mg/dL — ABNORMAL HIGH (ref 70–99)
Potassium: 4.4 mmol/L (ref 3.5–5.1)
Sodium: 133 mmol/L — ABNORMAL LOW (ref 135–145)

## 2023-02-26 LAB — CBG MONITORING, ED
Glucose-Capillary: 413 mg/dL — ABNORMAL HIGH (ref 70–99)
Glucose-Capillary: 390 mg/dL — ABNORMAL HIGH (ref 70–99)
Glucose-Capillary: 435 mg/dL — ABNORMAL HIGH (ref 70–99)
Glucose-Capillary: 396 mg/dL — ABNORMAL HIGH (ref 70–99)
Glucose-Capillary: 257 mg/dL — ABNORMAL HIGH (ref 70–99)

## 2023-02-26 MED ORDER — CLOPIDOGREL BISULFATE 75 MG PO TABS: 75.0000 mg | ORAL_TABLET | Freq: Every day | ORAL | 2 refills | Status: AC

## 2023-02-26 MED ORDER — CEPHALEXIN 500 MG PO CAPS: 500.0000 mg | ORAL_CAPSULE | Freq: Three times a day (TID) | ORAL | 0 refills | Status: AC

## 2023-02-26 MED ORDER — CLOPIDOGREL BISULFATE 75 MG PO TABS: 75.0000 mg | ORAL_TABLET | Freq: Every day | ORAL | Status: DC

## 2023-02-26 MED ORDER — CLOPIDOGREL BISULFATE 75 MG PO TABS
300.0000 mg | ORAL_TABLET | Freq: Once | ORAL | Status: AC
  Administered 2023-02-26: 300 mg via ORAL
  Filled 2023-02-26: qty 4

## 2023-02-26 MED ORDER — INSULIN ASPART 100 UNIT/ML IJ SOLN
20.0000 [IU] | Freq: Once | INTRAMUSCULAR | Status: AC
Start: 2023-02-26 — End: 2023-02-26
  Administered 2023-02-26: 20 [IU] via SUBCUTANEOUS
  Filled 2023-02-26: qty 1

## 2023-02-26 MED ORDER — ASPIRIN 81 MG PO TBEC: 81.0000 mg | DELAYED_RELEASE_TABLET | Freq: Every day | ORAL | 12 refills | Status: AC

## 2023-02-26 MED ORDER — INSULIN ASPART 100 UNIT/ML IJ SOLN: 20.0000 [IU] | Freq: Once | INTRAMUSCULAR | Status: DC

## 2023-02-26 MED ORDER — ROSUVASTATIN CALCIUM 40 MG PO TABS: 40.0000 mg | ORAL_TABLET | Freq: Every day | ORAL | 1 refills | Status: AC

## 2023-02-26 NOTE — ED Notes (Signed)
Breakfast tray at bedside 

## 2023-02-26 NOTE — ED Notes (Signed)
Pt verbalizes understanding of discharge instructions. Opportunity for questioning and answers were provided. Pt discharged from ED to home with family. Pt taken to lobby in wheelchair. Pt was able to ambulate in restroom without assistance.

## 2023-02-26 NOTE — ED Notes (Signed)
Messaged admitting provider regarding CBG.

## 2023-02-26 NOTE — Consult Note (Addendum)
 Hospital Consult    Reason for Consult:  CVA with bilateral carotid stenosis Requesting Physician:  Dr Amaryllis Dare MD  MRN #:  [id number]  History of Present Illness: This is a 43 y.o. 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  who presents to the emergency department for evaluation of facial droop, slurred speech and left hand numbness. Upon work up patient underwent CTA with very significant multivessel atherosclerotic disease, please see the full report. There was a question of dissection versus extensive thrombosis. Patient was seen by Neurology due to acute stroke on MRI of the Brain.   Per Neurology patients family endorses the patient is emotionally labile at baseline. 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 She does not have a legal guardian.   On exam this morning motor strength is 4 out of 5 on the left and 5 out of 5.  I did not appreciate any facial droop this morning.  Patient does speak with some dysarthria.  Noted anxiety/preservation as she wants to go home and go back to work.  She is able to name and repeat as asked.  Her left-sided neglect is very subtle.  Vascular surgery was consulted to evaluate patient and scan in the setting of acute stroke.  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  Authorizing Provider  hydrochlorothiazide (HYDRODIURIL) 25 MG tablet Take 25 mg by mouth daily. 10/13/22  Yes [provider]  insulin  lispro (HUMALOG) 100 UNIT/ML injection Inject 100 Units into the skin as directed. 100 units per day loaded into insulin  pump. 06/12/22  Yes [provider]  rosuvastatin  (CRESTOR ) 10 MG tablet Take 10 mg by mouth at bedtime. 02/06/23  Yes [provider]  TOUJEO SOLOSTAR 300 UNIT/ML Solostar Pen Inject 36 Units into the skin daily. 01/01/23  Yes [provider]  valsartan (DIOVAN) 320 MG tablet Take 320 mg by mouth daily. 12/12/22  Yes [provider]    Social History   Socioeconomic History   Marital status: Single    Spouse name: Not on file   Number of children: Not on file   Years of education: Not on file   Highest education level: Not on file  Occupational History   Not on file  Tobacco Use   Smoking status: Never   Smokeless tobacco: Never  Substance and Sexual Activity   Alcohol use: Not Currently   Drug use: Not on file   Sexual activity: Not on file  Other Topics Concern   Not on file  Social History Narrative   Not on file   Social Drivers of Health   Financial Resource Strain: Low Risk  (08/07/2022)   [facility]   Overall Financial Resource Strain (CARDIA)    Difficulty of Paying Living Expenses: Not  hard at all  Food Insecurity: Food Insecurity Present (01/21/2023)   Received from [facility]   Hunger Vital Sign    Worried About Running Out of Food in the Last Year: Often true    Ran Out of Food in the Last Year: Often true  Transportation Needs: No Transportation Needs (01/21/2023)   Received from [facility] - Transportation    Lack of Transportation (Medical): No    Lack of Transportation (Non-Medical): No  Physical Activity: Inactive (05/19/2021)   Received [facility]   Exercise Vital Sign    Days of Exercise per Week: 0 days     Minutes of Exercise per Session: 0 min  Stress: No Stress Concern Present (05/19/2021)   Received from [facility] Occupational [facility] - Occupational Stress Questionnaire    Feeling of Stress : Not at all  Social Connections: Moderately Integrated (05/19/2021)   Received from [facility]   Social Connection and Isolation Panel [NHANES]    Frequency of Communication with Friends and Family: Three times a week    Frequency of Social Gatherings with Friends and Family: Once a week    Attends Religious Services: 1 to 4 times per year    Active Member of [employer] or Organizations: No    Attends Banker Meetings: Never    Marital Status: Living with partner  Intimate Partner Violence: Not At Risk (05/19/2021)   Received from [facility]   Humiliation, Afraid, Rape, and Kick questionnaire    Fear of Current or Ex-Partner: No    Emotionally Abused: No    Physically Abused: No    Sexually Abused: No     History reviewed. No pertinent family history.  ROS: Otherwise negative unless mentioned in HPI  Physical Examination  Vitals:   02/26/23 0630 02/26/23 0800  BP: (!) 174/74 (!) 164/84  Pulse: 91 77  Resp: 16 14  Temp:    SpO2: 100% 98%   Body mass index is 37.19 kg/m.  General:  WDWN in NAD, emotionally labile, tearful and intermittently cooperative. Gait: Not observed HENT: WNL, normocephalic Pulmonary: normal non-labored breathing, without Rales, rhonchi,  wheezing Cardiac: regular, without  Murmurs, rubs or gallops; without carotid bruits Abdomen: Positive bowel sounds throughout, soft, NT/ND, no masses Skin:  rashes Vascular Exam/Pulses: Palpable pulses throughout, left foot with dressing and boot due to wound. Extremities: without ischemic changes, without Gangrene , without cellulitis; with open wounds; to the left foot per report of the patient. Musculoskeletal: no muscle wasting or atrophy  Neurologic: A&O X 3; focal weakness  for left hemiplegia 4 out of 5 upper extremity and lower extremity.  Patient noted to have numbness and tingling to left arm.  Right side 5 out of 5 motor strength with no numbness and tingling. Psychiatric:  The pt has Abnormal- nervous and anxious  affect.  Emotionally labile, tearful and intermittently cooperative. Lymph:  Unremarkable  CBC    Component Value Date/Time   WBC 9.3 02/26/2023 0901   RBC 4.44 02/26/2023 0901   HGB 11.7 (L) 02/26/2023 0901   HCT 36.9 02/26/2023 0901   PLT 262 02/26/2023 0901   MCV 83.1 02/26/2023 0901   MCH 26.4 02/26/2023 0901   MCHC 31.7 02/26/2023 0901   RDW 13.7 02/26/2023 0901   LYMPHSABS 3.7 02/24/2023 2123   MONOABS 0.5 02/24/2023 2123   EOSABS 0.3 02/24/2023 2123   BASOSABS 0.0 02/24/2023 2123  BMET    Component Value Date/Time   NA 133 (L) 02/26/2023 0901   K 4.4 02/26/2023 0901   CL 98 02/26/2023 0901   CO2 24 02/26/2023 0901   GLUCOSE 402 (H) 02/26/2023 0901   BUN 26 (H) 02/26/2023 0901   CREATININE 0.90 02/26/2023 0901   CALCIUM  8.7 (L) 02/26/2023 0901   GFRNONAA >60 02/26/2023 0901    COAGS: Lab Results  Component Value Date   INR 0.9 02/24/2023     Non-Invasive Vascular Imaging:   EXAM:02/24/23 MRI HEAD WITHOUT CONTRAST   TECHNIQUE: Multiplanar, multiecho pulse sequences of the brain and surrounding structures were obtained without intravenous contrast.   COMPARISON:  CT head.   FINDINGS: Brain: Multiple small acute infarcts in the right frontal lobe and right parietal lobe. Additional small acute infarct in the medial right temporal lobe and infarct in the body of the corpus callosum. Edema without substantial mass effect. Remote left frontal cortical infarct. Mild to moderate scattered T2/FLAIR hyperintensities not matter compatible with chronic microvascular disease.   Vascular: Major arterial flow voids are maintained.   Skull and upper cervical spine: Normal marrow signal.   Sinuses/Orbits: Moderate  paranasal sinus mucosal thickening. No acute orbital findings.   Other: No sizable mastoid effusions.   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  EXAM:02/25/2023 CT ANGIOGRAPHY HEAD AND NECK WITH AND WITHOUT CONTRAST   TECHNIQUE: Multidetector CT imaging of the head and neck was performed using the standard protocol during bolus administration of intravenous contrast. Multiplanar CT image reconstructions and MIPs were obtained to evaluate the vascular anatomy. Carotid stenosis measurements (when applicable) are obtained utilizing NASCET criteria, using the distal internal carotid diameter as the denominator.   RADIATION DOSE REDUCTION: This exam was performed according to the departmental dose-optimization program which includes automated exposure control, adjustment of the mA and/or kV according to patient size and/or use of iterative reconstruction technique.   CONTRAST:  75mL OMNIPAQUE  IOHEXOL  350 MG/ML SOLN   COMPARISON:  02/24/2023 CT head, 02/24/2023 MRI   FINDINGS: CT HEAD FINDINGS   Evaluation is somewhat limited by motion artifact.   Brain: Hypodensity in the right body of the corpus callosum, best seen on the coronal images, correlates with the acute infarct noted on the 02/24/2023 MRI. Additional smaller foci of acute infarct in the periventricular white matter, right temporal lobe, and right parietal lobe are not well seen on this exam.   No evidence of additional acute infarct, hemorrhage, mass, mass effect, or midline shift. No hydrocephalus or extra-axial fluid collection.   Vascular: Please see CTA findings below.   Skull: Negative for fracture or focal lesion.   Sinuses/Orbits: Mucosal thickening in the ethmoid air cells. No acute finding in the orbits.   CTA NECK FINDINGS   Evaluation is somewhat limited by motion artifact.   Aortic arch: Two-vessel arch with a  common origin of the brachiocephalic and left common carotid arteries. Imaged portion shows no evidence of aneurysm or dissection. No significant stenosis of the major arch vessel origins.   Right carotid system: Near complete nonopacification of the left ICA just distal to its origin (series 15, image 187), with an appearance concerning for dissection versus extensive thrombosis. Minimal flow is seen throughout the remainder of the cervical ICA (series 15, image 145, for example), with reconstitution mid supraclinoid ICA. There is also severe narrowing at the origin of right ECA, which appears otherwise patent. The CCA  is patent. One of the right PCA branches also demonstrates multifocal narrowing (series 15, images 144 and 138).   Left carotid system: At least 75% narrowing at the origin of the left ICA, with irregularity in the proximal left ICA and possible linear filling defect in the mid left ICA (series 15, image 164). The distal left ICA is patent. Additional severe narrowing at the origin of the left ECA (series 15, image 190), which is otherwise patent. The CCA is unremarkable.   Vertebral arteries: Noncalcified plaque or thrombus narrows the left subclavian artery (series 15, image 279), although this is not hemodynamically significant. 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 The left vertebral artery is otherwise patent to the skull base.   Moderate stenosis in the right V1 (series 15, image 247). The right vertebral artery is otherwise patent to the skull base.   Skeleton: No acute osseous abnormality. Degenerative changes in the cervical spine.   Other neck: 1 cm hypoenhancing nodule in the left thyroid lobe, for which no follow-up is currently indicated. (Reference: J Am Coll Radiol. 2015 Feb;12(2): 143-50)   Upper chest: No focal pulmonary opacity or pleural effusion.   Review  of the MIP images confirms the above findings   CTA HEAD FINDINGS   Anterior circulation: Minimal opacification of right ICA until the supraclinoid segment, where there is severe calcification, after which the right ICA is patent and normal in caliber. Moderate narrowing of the proximal left cavernous ICA (series 15, image 110) and mild stenosis in the distal left cavernous ICA. Moderate stenosis in the proximal left supraclinoid ICA. The left ICA is patent to terminus.   A1 segments patent. Normal anterior communicating artery. Anterior cerebral arteries are patent to their distal aspects without significant stenosis.   No M1 stenosis or occlusion. MCA branches perfused to their distal aspects without significant stenosis.   Posterior circulation: Vertebral arteries patent to the vertebrobasilar junction without significant stenosis. Posterior inferior cerebellar arteries patent proximally.   Basilar patent to its distal aspect without significant stenosis. Superior cerebellar arteries patent proximally.   Patent P1 segments. PCAs perfused to their distal aspects without significant stenosis. The bilateral posterior communicating arteries are not visualized.   Venous sinuses: As permitted by contrast timing, patent.   Anatomic variants: None significant.   No evidence of aneurysm or vascular malformation.   Review of the MIP images confirms the above findings   IMPRESSION: 1. Near complete nonopacification of the right ICA just distal to its origin, with an appearance concerning for dissection versus extensive thrombosis. Minimal flow is seen throughout the remainder of the cervical ICA, with reconstitution in the mid supraclinoid ICA, just distal to an area of focal calcification. 2. At least 75% narrowing at the origin of the left ICA, similarly concerning for thrombosis versus dissection, with irregularity in the proximal left ICA and possible linear filling defect  in the mid left ICA, which could also represent dissection. Moderate narrowing in the left cavernous and supraclinoid ICA. 3. Severe narrowing at the origin of the left vertebral artery and in the left V1. Moderate stenosis in the right V1. 4. Severe narrowing at the origin of the bilateral ECAs, with additional multifocal narrowing in 1 of the right ECA branches. 5. Hypodensity in the right body of the corpus callosum, best seen on the coronal images, which correlates with the acute infarct noted on the 02/24/2023 MRI.  Additional smaller foci of acute infarct in the periventricular white matter, right temporal lobe, and right parietal lobe are not well seen on this exam, which may be due to motion.   Code stroke imaging results were communicated on 02/25/2023 at 2:20 pm to provider BHAGAT via secure text paging.  Statin:  Yes.   Beta Blocker:  No. Aspirin :  No. ACEI:  No. ARB:  Yes.   CCB use:  No Other antiplatelets/anticoagulants:  No.    ASSESSMENT/PLAN: This is a 44 y.o. female who presented to [facility] emergency department 2 days ago for evaluation of facial droop and slurred speech.  There is no report of any falls trauma syncope or injuries at that time. Upon workup patient was noted to have complete occlusion of the right ICA just distal to its origin and appearance was concerning for dissection versus extensive thrombosis minimal flow was seen through the remainder of the cervical ICA she was also noted to have at least 75% narrowing of the origin of the left ICA with similar concerns for thrombosis versus dissection.  MRI of the patient's brain shows acute infarcts in the right frontal, parietal and temporal lobes and in the right body of the corpus callosum callosum.  After review of all scans with Dr. Selinda Gu MD vascular surgery suspects that the patient has fibromuscular dysplasia with hypertension creating what is seen on scan as dissections.  Right carotid artery appears to  be occluded although there could be a tiny flow-limiting dissection.  It continues all the way intracranial beyond where we typically treat and have not generally not treat her since this soon after a stroke.  She also has significant left carotid stenosis and is going to need an angiogram in a week or so to allow the brain to heal somewhat from her noted stroke on MRI.  Vascular surgery is also concerned she could have fibromuscular dysplasia in her renal arteries that may be driving her severe hypertension thus creating carotid issues.  Vascular surgery recommends an interventional neurosurgeon either at [facility] or [facility] to address her intracranial disease.  Plan would be to see the patient back in 1 week to discuss possible angiogram of her renal arteries as well as her left carotid stenosis that would be treatable by endovascular stent placement.  Vascular surgery agrees with neurology's treatment of loading the patient with 300 mg of Plavix  and continuing on a daily dose for at least 3 months.  Continue goal gradual normotension due to the possibility this is fibromuscular dysplasia.  Increase the patient's Crestor  from 10 mg to 40 mg to help achieve LDL goal of less than 70.  Vascular surgery also recommends patient be on dual antiplatelet therapy such as aspirin  81 mg daily and Plavix  75 mg daily with the current use of her statin medication that has now been increased.   -I discussed the plan in detail with Dr. Selinda Gu MD and he agrees with the plan.  As an addendum to this note patient has been seen prior at [facility]  Therefore we will place a consult to Dr. Pat President, interventional radiologist in neurology at the [facility].  Gwendlyn JONELLE Shank [facility] 02/26/2023 9:52 AM

## 2023-02-26 NOTE — Progress Notes (Addendum)
 NEUROLOGY CONSULT FOLLOW UP NOTE   Date of service: February 26, 2023 Patient Name: Angel Gray MRN:  [id number] DOB:  02-Dec-1980  Interval Hx/subjective   -Had trouble taking plavix  last night due to gagging on pills (baseline per patient) but was able to tolerate loading dose this morning -Feels symptoms stable, no fluctuations  -Sugars elevated (dexacom and pump removed for MRI yesterday)  Vitals   Vitals:   02/26/23 0545 02/26/23 0600 02/26/23 0630 02/26/23 0800  BP:  (!) 166/73 (!) 174/74 (!) 164/84  Pulse: 80 86 91 77  Resp: 13 13 16 14   Temp:    98 F (36.7 C)  TempSrc:      SpO2: 97% 100% 100% 98%  Weight:      Height:         Body mass index is 37.19 kg/m.  Physical Exam   Constitutional: Cushingoid body habitus  Psych: Affect appropriate to situation -- calm and cooperative this morning, much improved from yesterday  Eyes: No scleral injection.  HENT: No OP obstruction.  Head: Normocephalic.  Cardiovascular: Normal rate and regular rhythm.  Respiratory: Effort normal, non-labored breathing.    Neurologic Examination   Physical Exam  Constitutional: Appears well-developed and well-nourished.  Psych: Affect appropriate to situation Eyes: No scleral injection HENT: No OP obstrucion MSK: no joint deformities.  Cardiovascular: Normal rate and regular rhythm.  Respiratory: Effort normal, non-labored breathing GI: Soft.  No distension. There is no tenderness.  Skin: WDI  Neuro: Mental Status: Patient is awake, alert, oriented to person, place, month, year, and situation. Patient is able to give a clear and coherent history; good recall of our discussion yesterday  No signs of aphasia or again only very subtle minimal neglect (trouble with the LUQ of vision with double simultaneous stim but not other quadrants, able to count fingers well with no distractors) Cranial Nerves: II: Visual Fields are full. Pupils are equal, round, and reactive to light.    III,IV, VI: EOMI without ptosis or diploplia. Mild bilateral esotropia V: Facial sensation is symmetric to light touch VII: Facial movement with a left facial droop mildly improved from yesterday  VIII: hearing is intact to voice X: Uvula elevates symmetrically XI: Shoulder shrug is reduced on the left 4/5 XII: tongue is midline without atrophy or fasciculations.  Motor: Tone is normal. Bulk is normal. 5/5 bilateral lower extremites. 4/5 left deltoid and 4/5 grip with slowed left hand finger movements (notably this is her dominant hand), otherwise 4+ to 5 in the LUE and 5/5 in the RUE  Sensory: Sensation is symmetric to light touch and temperature in the arms and legs. Cerebellar: FNF intact bilaterally and HKS intact on the right, limited on the left which patient reports is due to her boot in place.  However I do think she does have some ataxia on the left  NIHSS total 3 Score breakdown: Left facial droop (2) and left arm drift (1)    Medications  Current Facility-Administered Medications:     stroke: early stages of recovery book, , Does not apply, Once, Caleen Qualia, MD   acetaminophen  (TYLENOL ) tablet 650 mg, 650 mg, Oral, Q6H PRN **OR** acetaminophen  (TYLENOL ) suppository 650 mg, 650 mg, Rectal, Q6H PRN, Adefeso, Oladapo, DO   aspirin  EC tablet 81 mg, 81 mg, Oral, Daily, Adefeso, Oladapo, DO, 81 mg at 02/26/23 0857   benzocaine  (HURRICAINE) 20 % mouth spray, , Mouth/Throat, BID PRN, Amin, Sumayya, MD   cefTRIAXone  (ROCEPHIN ) 1 g in sodium  chloride 0.9 % 100 mL IVPB, 1 g, Intravenous, Q24H, Adefeso, Oladapo, DO, Stopped at 02/25/23 2316   [START ON 02/27/2023] clopidogrel  (PLAVIX ) tablet 75 mg, 75 mg, Oral, Daily, Annielee Jemmott L, MD   enoxaparin  (LOVENOX ) injection 45 mg, 0.5 mg/kg, Subcutaneous, Q24H, Adefeso, Oladapo, DO   feeding supplement (GLUCERNA SHAKE) (GLUCERNA SHAKE) liquid 237 mL, 237 mL, Oral, TID BM, Adefeso, Oladapo, DO, 237 mL at 02/26/23 0945   insulin  aspart  (novoLOG ) injection 0-15 Units, 0-15 Units, Subcutaneous, TID WC, Adefeso, Oladapo, DO, 8 Units at 02/25/23 1716   prochlorperazine  (COMPAZINE ) injection 10 mg, 10 mg, Intravenous, Q6H PRN, Adefeso, Oladapo, DO   rosuvastatin  (CRESTOR ) tablet 40 mg, 40 mg, Oral, Daily, Amin, Sumayya, MD, 40 mg at 02/26/23 0945  Current Outpatient Medications:    hydrochlorothiazide (HYDRODIURIL) 25 MG tablet, Take 25 mg by mouth daily., Disp: , Rfl:    insulin  lispro (HUMALOG) 100 UNIT/ML injection, Inject 100 Units into the skin as directed. 100 units per day loaded into insulin  pump., Disp: , Rfl:    rosuvastatin  (CRESTOR ) 10 MG tablet, Take 10 mg by mouth at bedtime., Disp: , Rfl:    TOUJEO SOLOSTAR 300 UNIT/ML Solostar Pen, Inject 36 Units into the skin daily., Disp: , Rfl:    valsartan (DIOVAN) 320 MG tablet, Take 320 mg by mouth daily., Disp: , Rfl:   Labs and Diagnostic Imaging   CBC:  Recent Labs  Lab 02/24/23 2123 02/25/23 1241 02/26/23 0901  WBC 7.7 6.9 9.3  NEUTROABS 3.1  --   --   HGB 12.6 11.5* 11.7*  HCT 38.7 34.6* 36.9  MCV 83.2 82.6 83.1  PLT 220 226 262    Basic Metabolic Panel:  Lab Results  Component Value Date   NA 133 (L) 02/26/2023   K 4.4 02/26/2023   CO2 24 02/26/2023   GLUCOSE 402 (H) 02/26/2023   BUN 26 (H) 02/26/2023   CREATININE 0.90 02/26/2023   CALCIUM  8.7 (L) 02/26/2023   GFRNONAA >60 02/26/2023   Lipid Panel:  Lab Results  Component Value Date   CHOL 141 02/25/2023   HDL 27 (L) 02/25/2023   LDLCALC 80 02/25/2023   TRIG 168 (H) 02/25/2023   CHOLHDL 5.2 02/25/2023     HgbA1c:  Lab Results  Component Value Date   HGBA1C 8.1 (H) 02/24/2023   Worsening compared to recent A1c of 7.4% in 01/21/2023  Urine Drug Screen:     Component Value Date/Time   LABOPIA NONE DETECTED 02/24/2023 2205   COCAINSCRNUR NONE DETECTED 02/24/2023 2205   LABBENZ NONE DETECTED 02/24/2023 2205   AMPHETMU NONE DETECTED 02/24/2023 2205   THCU NONE DETECTED 02/24/2023 2205    LABBARB NONE DETECTED 02/24/2023 2205    Alcohol Level     Component Value Date/Time   ETH <10 02/24/2023 2123   INR  Lab Results  Component Value Date   INR 0.9 02/24/2023   APTT  Lab Results  Component Value Date   APTT 25 02/24/2023    CT angio Head and Neck with contrast(Personally reviewed): 1. Near complete nonopacification of the right ICA just distal to its origin, with an appearance concerning for dissection versus extensive thrombosis. Minimal flow is seen throughout the remainder of the cervical ICA, with reconstitution in the mid supraclinoid ICA, just distal to an area of focal calcification. 2. At least 75% narrowing at the origin of the left ICA, similarly concerning for thrombosis versus dissection, with irregularity in the proximal left ICA and possible linear filling  defect in the mid left ICA, which could also represent dissection. Moderate narrowing in the left cavernous and supraclinoid ICA. 3. Severe narrowing at the origin of the left vertebral artery and in the left V1. Moderate stenosis in the right V1. 4. Severe narrowing at the origin of the bilateral ECAs, with additional multifocal narrowing in 1 of the right ECA branches. 5. Hypodensity in the right body of the corpus callosum, best seen on the coronal images, which correlates with the acute infarct noted on the 02/24/2023 MRI. Additional smaller foci of acute infarct in the periventricular white matter, right temporal lobe, and right parietal lobe are not well seen on this exam, which may be due to motion.   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 Remote left frontal infarct and scattered UBO   ECHO  1. Left ventricular ejection fraction, by estimation, is 55 to 60%. The  left ventricle has normal function. The left ventricle has no regional  wall motion abnormalities. Left ventricular  diastolic parameters are  consistent with Grade I diastolic  dysfunction (impaired relaxation).   2. Right ventricular systolic function is normal. The right ventricular  size is normal.   3. The mitral valve is normal in structure. No evidence of mitral valve  regurgitation. No evidence of mitral stenosis.   4. The aortic valve is normal in structure. Aortic valve regurgitation is  not visualized. No aortic stenosis is present.   5. The inferior vena cava is normal in size with greater than 50%  respiratory variability, suggesting right atrial pressure of 3 mmHg.   6. The interatrial septum is aneurysmal. No atrial level shunt detected  by color flow Doppler.  [Normal biatrial sizes]    Assessment   XITLALY AULT is a 43 y.o. 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  Given the extent of her vessel disease, she would benefit from diagnostic angio with intent to treat, which would best be completed in approximately 1 week given her recent stroke.  Recommendations  -Reordered Plavix  300 mg today as she did not receive the dose yesterday evening.  Continue dual antiplatelet therapy for at least 3 months followed if no intervention, otherwise if there is an intervention antiplatelet regimen per interventional team -Outpatient referral to neurointerventional radiology for consideration of stenting, would ideally wait approximately 1 week from now to reduce risk of further stroke from her current clot burden but needs urgent referral due to severity of disease -Continue goal gradual normotension -Goal euglycemia, goal A1c less than 7%, appreciate diabetes management team involvement -Increase of Crestor  from 10 mg home dose to 40 mg to achieve LDL goal less than 70; currently LDL 80 -Inpatient neurology will sign off at this time, please do reactivate code stroke for any significant neurological worsening  or new stroke symptoms as patient is at high risk for worsening given the severity of her bilateral carotid disease, right worse than left -Discussed with Dr. Caleen and diabetes coordinator in person ______________________________________________________________________  Lola Jernigan MD-PhD [facility] [phone] [facility] coverage for [facility] is from 8 AM to 4 AM in-house and 4 PM to 8 PM by telephone/video. 8 PM to 8 AM emergent questions or overnight urgent questions should be addressed to Teleneurology On-call or Jolynn Pack neurohospitalist; contact information can be found on AMION

## 2023-02-26 NOTE — Inpatient Diabetes Management (Signed)
Inpatient Diabetes Program Recommendations  AACE/ADA: New Consensus Statement on Inpatient Glycemic Control (2015)  Target Ranges:  Prepandial:   less than 140 mg/dL      Peak postprandial:   less than 180 mg/dL (1-2 hours)      Critically ill patients:  140 - 180 mg/dL   Lab Results  Component Value Date   GLUCAP 390 (H) 02/26/2023   HGBA1C 8.1 (H) 02/24/2023   10:00 am Spoke with patient and Dr. Nelson Chimes @ bedside regarding restarting insulin pump and supplies. Patient has supplies and insulin @ home and wants to restart insulin pump as soon as she arrives home. RN to monitor CBGs post Novolog given to evaluate for hypoglycemia. Will continue to assist as needed and follow while hospitalized.  Thank you, Billy Fischer. Breyonna Nault, RN, MSN, CDCES  Diabetes Coordinator Inpatient Glycemic Control Team Team Pager [phone] (8am-5pm) 02/26/2023 9:56 AM

## 2023-02-26 NOTE — ED Notes (Signed)
Patient hypertensive, trending moreso now last BP 174/74. Reached out to Jawu NP to clarify if permissive htn is being allowed in this setting, per NP will defer to daytime MD.

## 2023-02-26 NOTE — ED Notes (Addendum)
Dr Nelson Chimes notified of bmp glucose 402. Orders to be placed as needed. Verbal orders for 20units novolog repeated

## 2023-02-26 NOTE — ED Notes (Signed)
 Patient walking with PT.

## 2023-02-26 NOTE — Progress Notes (Signed)
 Physical Therapy Treatment Patient Details Name: Angel Gray MRN: [id number] DOB: 08-22-1980 Today's Date: 02/26/2023   History of Present Illness Angel Gray is a 42yoF who comes [facility] on [facility]/24[facility]23 after family noticed Left facial droop. Recent (+) flu. PMH: DM2, HTN, HLD. In ED noted left hadn numbness and slurred speech. MRI brain without contrast showed acute infarcts in the right frontal, parietal and temporal lobes and right body of right corpus callosum. IV ceftriaxone  was given due to UTI. Also noted hypoalbuminemia due to protein-calorie malnutrition.    PT Comments  Author returns to assess longer distance AMB that was not observed at evaluation previous day. Pt with LOB previous day with transfer and AMB 40ft across room, however, pt moving a little better today despite sleepiness, xfer from gurney, AMB to toilet, xfer from toilet, unsupported clothing management, hand hygiene, then AMB >252ft around unit, albeit speed lower than age matched norms, gait appears steady, well tolerated, and low to medium effort. Pt denies any acute impairment of balance or leg strength. 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 Anticipate ability to make safe DC to home, agree with SLP recommendations for further assessment of cognitive processing post DC. No DME needs. HHPT would be useful, pt needing multi disciplinary services, however, I am concerned that pt living in [place], this clinic will be too far for patient to come to twice weekly or might be financial burden as well. Will follow.    If plan is discharge home, recommend the following: A little help with walking and/or transfers;Direct supervision/assist for financial management;Assist for transportation;Assistance with cooking/housework;Help with stairs or ramp for entrance   Can travel by  private vehicle     Yes  Equipment Recommendations  None recommended by PT    Recommendations for Other Services       Precautions / Restrictions Precautions Precautions: Fall Restrictions Other Position/Activity Restrictions: has shoe for L foot wound in room reports she has worn for 2-3 months     Mobility  Bed Mobility Overal bed mobility: Needs Assistance Bed Mobility: Supine to Sit     Supine to sit: Modified independent (Device/Increase time)     General bed mobility comments: author disconnects lead for mobility in hall    Transfers Overall transfer level: Needs assistance Equipment used: None Transfers: Sit to/from Stand Sit to Stand: Supervision           General transfer comment: from high ED gurney    Ambulation/Gait Ambulation/Gait assistance: Supervision Gait Distance (Feet): 272 Feet Assistive device: None Gait Pattern/deviations: Step-through pattern       General Gait Details: less unsteadiness today; pt denies any acute imbalance or leg weakness.   Stairs             Wheelchair Mobility     Tilt Bed    Modified Rankin (Stroke Patients Only)       Balance Overall balance assessment: Modified Independent                                          Cognition Arousal:  (sleepy, asleep on entry) Behavior During Therapy: WFL for tasks assessed/performed Overall Cognitive Status: Within Functional Limits for tasks assessed  Exercises Other Exercises Other Exercises: Pt AMB to toilet, then manages transfer/pericare on her own, washes hands withotu support, no LOB    General Comments        Pertinent Vitals/Pain Pain Assessment Pain Assessment: No/denies pain    Home Living                          Prior Function            PT Goals (current goals can now be found in the care plan section) Acute Rehab PT Goals Patient Stated  Goal: wants to leavethe hospital as soon as possible. PT Goal Formulation: With patient Time For Goal Achievement: 03/11/23 Progress towards PT goals: Progressing toward goals    Frequency    7X/week      PT Plan      Co-evaluation              AM-PAC PT 6 Clicks Mobility   Outcome Measure  Help needed turning from your back to your side while in a flat bed without using bedrails?: A Little Help needed moving from lying on your back to sitting on the side of a flat bed without using bedrails?: A Little Help needed moving to and from a bed to a chair (including a wheelchair)?: A Lot Help needed standing up from a chair using your arms (e.g., wheelchair or bedside chair)?: A Lot Help needed to walk in hospital room?: A Lot Help needed climbing 3-5 steps with a railing? : A Lot 6 Click Score: 14    End of Session   Activity Tolerance: Patient tolerated treatment well;No increased pain Patient left: in bed (seated edge of gurney with meal presents)   PT Visit Diagnosis: Unsteadiness on feet (R26.81);Difficulty in walking, not elsewhere classified (R26.2);Other abnormalities of gait and mobility (R26.89)     Time: 9177-9159 PT Time Calculation (min) (ACUTE ONLY): 18 min  Charges:    $Therapeutic Activity: 8-22 mins PT General Charges $$ ACUTE PT VISIT: 1 Visit                    8:58 AM, 02/26/23 Peggye JAYSON Linear, PT, DPT Physical Therapist - [facility]  [phone] (ASCOM)    Bera Pinela C 02/26/2023, 8:50 AM

## 2023-02-26 NOTE — Inpatient Diabetes Management (Signed)
Inpatient Diabetes Program Recommendations  AACE/ADA: New Consensus Statement on Inpatient Glycemic Control   Target Ranges:  Prepandial:   less than 140 mg/dL      Peak postprandial:   less than 180 mg/dL (1-2 hours)      Critically ill patients:  140 - 180 mg/dL    Latest Reference Range & Units 02/26/23 07:58 02/26/23 09:08 02/26/23 09:59  Glucose-Capillary 70 - 99 mg/dL 161 (H) 096 (H) 045 (H)    Latest Reference Range & Units 02/25/23 07:26 02/25/23 12:36 02/25/23 16:37 02/25/23 21:30  Glucose-Capillary 70 - 99 mg/dL 409 (H) 811 (H) 914 (H) 262 (H)   Review of Glycemic Control  Diabetes history: DM2 Outpatient Diabetes medications: OmniPod insulin pump with Humalog, Toujeo 36 units daily (only used when not on pump)  Current orders for Inpatient glycemic control: Novolog 0-15 units TID with meals  Inpatient Diabetes Program Recommendations:    Insulin: Per RN, patient's insulin pump removed prior to MRI and CBG 435 mg/dl at 7:82 am today.  Novolog 40 units given in the past 2 hours which increases risk of hypoglycemia. MD plans to discharge patient home today and patient plans to start insulin pump when she is discharged home.   NOTE: Received chat message from [place], [place] regarding patient's glucose trends. RN reports that patient does not have on her insulin pump; it was removed prior to MRI. Per chart review, noted patient sees Dr. Leda Roys for DM management and was last seen on 02/01/23. Per office note on 02/01/23, patient is using OmniPod insulin pump with Humalog and settings should be:   Basal 12A  1.25 units/hr Total basal 30 units/24 hours  Insulin to Carb Ratio 1:2 g (patient does not carb count so advised to enter 15 grams for meals)  Insulin Sensitivity 1:25 mg/dl (1 unit drops glucose 25 mg/dl)  Target Glucose 956 Active insulin time 3 hours  Darel Hong, RN (Diabetes coordinator) to talk to patient today.  Thanks, Orlando Penner, RN, MSN, CDCES Diabetes  Coordinator Inpatient Diabetes Program [phone] (Team Pager from 8am to 5pm)

## 2023-02-26 NOTE — ED Notes (Signed)
Dr Nelson Chimes notified of cbg 413. Orders placed for novolog and to recheck cbg in hour.

## 2023-02-26 NOTE — Discharge Summary (Signed)
 Physician Discharge Summary   Patient: Angel Gray MRN: [id number] DOB: 11/09/1980  Admit date:     02/24/2023  Discharge date: 02/26/23  Discharge Physician: Amaryllis Dare   PCP: Patient, No Pcp Per   Recommendations at discharge:  Please obtain CBC and BMP on follow-up Please follow-up final urine culture results Follow-up with vascular surgery Patient need to be followed up by neurointerventional radiology for extensive vascular disease. Follow-up with outpatient neurology Follow-up with primary care provider  Discharge Diagnoses: Principal Problem:   Acute ischemic stroke [facility]) Active Problems:   UTI (urinary tract infection)   Hypoalbuminemia due to protein-calorie malnutrition (HCC)   Transaminitis   Essential hypertension   Mixed hyperlipidemia   Type 2 diabetes mellitus with hyperglycemia (HCC)   Slurred speech   Facial droop   Hospital Course: Taken from H&P.  Angel Gray is a 43 y.o. female with medical history significant of type 2 diabetes mellitus, hypertension, hyperlipidemia who presents to the emergency department for evaluation of facial droop, slurred speech and left hand numbness.   On presentation patient has significantly elevated blood pressure at 210/83, blood glucose 162, BUN 23, AST 50, ALT 53, ALP 144.  UA positive for UTI, negative UDS. CT head without contrast showed no acute intracranial abnormality MRI brain without contrast showed acute infarcts in the right frontal, parietal and temporal lobes and right body of right corpus callosum. IV ceftriaxone  was given due to UTI Teleneurology was consulted and recommended further stroke workup.  2/3: Hemodynamically stable, multiple acute infarcts.  Improving transaminitis, Lipid panel with triglyceride of 168, HDL 27 and LDL of 80, urine cultures pending.  Echocardiogram with normal EF and grade 1 diastolic dysfunction, no other significant abnormality, no septal defect.  Swallow evaluation with  mild oral deficit, recommending speech therapy on discharge.  2/4: CTA with very significant multivessel atherosclerotic disease, please see the full report. There was a question of dissection versus extensive thrombosis. Vascular surgery was also consulted, patient has significant vascular disease and concern of fibromuscular dysplasia.  They are recommending angiography which will be arranged as outpatient in 1 to 2 weeks so she can recovered from current stroke.  Patient need to follow-up with a neuro interventional radiologist at [facility] or [facility] for further assistance as she will need quite extensive procedure.  She was also instructed to keep her blood pressure in 130s to 140s for better perfusion of brain due to extensive vascular disease.  There was also concern of involvement of renal vessels as her imaging is way in proportionate than her age and lipid profile. PT is recommending outpatient physical therapy. Neurology currently recommending DAPT and high intensity statin.  Had a lengthy discussion with patient, her aunt and neurology also discussed with her sister for better assistance and a close follow-up as outpatient to decrease her risk of further stroke.  She is at very high risk for a crippling stroke.  Her urine cultures growing gram-negative rods, she received ceftriaxone  in hospital and was given Keflex  on discharge.  Her PCP can follow-up the final culture results.  Patient also developed hyperglycemia, BMP was negative for any acidosis or gap, patient uses insulin  pump at home which he she will continue.  She was given multiple doses of short acting with resultant improvement in blood glucose level.  She will continue on current medications and need to have a very close follow-up with her providers for further assistance.   Consultants: Neurology.  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 Swallow whole. Start taking on: February 27, 2023   cephALEXin  500 MG capsule Commonly known as: KEFLEX  Take 1 capsule (500 mg total) by mouth 3 (three) times daily for 4 days.   clopidogrel  75 MG tablet Commonly known as: PLAVIX  Take 1 tablet (75 mg total) by mouth daily. Start taking on: February 27, 2023   hydrochlorothiazide 25 MG tablet Commonly known as: HYDRODIURIL Take 25 mg by mouth daily.   insulin  lispro 100 UNIT/ML injection Commonly known as: HUMALOG Inject 100 Units into the skin as directed. 100 units per day loaded into insulin  pump.   rosuvastatin  40 MG tablet Commonly known as: CRESTOR  Take 1 tablet (40 mg total) by mouth daily. Start taking on: February 27, 2023 What changed:  medication strength how much to take when to take this   Toujeo SoloStar 300 UNIT/ML Solostar Pen Generic drug: insulin  glargine (1 Unit Dial) Inject 36 Units into the skin daily.   valsartan 320 MG tablet Commonly known as: DIOVAN Take 320 mg by mouth daily.        Follow-up Information     Dew, Selinda RAMAN, MD Follow up in 1 week(s).   Specialties: Vascular Surgery, Radiology, Interventional Cardiology Why: No tests Contact information: [address] [place] [address] [phone]                Discharge Exam: Angel Gray   02/25/23 0420  Weight: 89.3 kg   General.  Well-developed lady, in no  acute distress. Pulmonary.  Lungs clear bilaterally, normal respiratory effort. CV.  Regular rate and rhythm, no JVD, rub or murmur. Abdomen.  Soft, nontender, nondistended, BS positive. CNS.  Alert and oriented .  Mild right facial drop, mild right-sided weakness. Extremities.  No edema, no cyanosis, pulses intact and symmetrical. Psychiatry.  Judgment and insight appears normal.   Condition at discharge: Goodstable  The results of significant diagnostics from this hospitalization (including imaging, microbiology, ancillary and laboratory) are listed below for reference.   Imaging Studies: CT ANGIO HEAD NECK W WO CM Result Date: 02/25/2023 CLINICAL DATA:  Left-sided facial droop, generalized weakness for a week EXAM: CT ANGIOGRAPHY HEAD AND NECK WITH AND WITHOUT CONTRAST TECHNIQUE: Multidetector CT imaging of the head and neck was performed using the standard protocol during bolus administration of intravenous contrast. Multiplanar CT image reconstructions and MIPs were obtained to evaluate the vascular anatomy. Carotid stenosis measurements (when applicable) are obtained utilizing NASCET criteria, using the distal internal carotid diameter as the denominator. RADIATION DOSE REDUCTION: This exam was performed according to the departmental dose-optimization program which includes automated exposure control, adjustment of the mA and/or kV according to patient size  and/or use of iterative reconstruction technique. CONTRAST:  75mL OMNIPAQUE  IOHEXOL  350 MG/ML SOLN COMPARISON:  02/24/2023 CT head, 02/24/2023 MRI FINDINGS: CT HEAD FINDINGS Evaluation is somewhat limited by motion artifact. Brain: Hypodensity in the right body of the corpus callosum, best seen on the coronal images, correlates with the acute infarct noted on the 02/24/2023 MRI. Additional smaller foci of acute infarct in the periventricular white matter, right temporal lobe, and right parietal lobe are not well seen on this exam. No evidence  of additional acute infarct, hemorrhage, mass, mass effect, or midline shift. No hydrocephalus or extra-axial fluid collection. Vascular: Please see CTA findings below. Skull: Negative for fracture or focal lesion. Sinuses/Orbits: Mucosal thickening in the ethmoid air cells. No acute finding in the orbits. CTA NECK FINDINGS Evaluation is somewhat limited by motion artifact. Aortic arch: Two-vessel arch with a common origin of the brachiocephalic and left common carotid arteries. Imaged portion shows no evidence of aneurysm or dissection. No significant stenosis of the major arch vessel origins. Right carotid system: Near complete nonopacification of the left ICA just distal to its origin (series 15, image 187), with an appearance concerning for dissection versus extensive thrombosis. Minimal flow is seen throughout the remainder of the cervical ICA (series 15, image 145, for example), with reconstitution mid supraclinoid ICA. There is also severe narrowing at the origin of right ECA, which appears otherwise patent. The CCA is patent. One of the right PCA branches also demonstrates multifocal narrowing (series 15, images 144 and 138). Left carotid system: At least 75% narrowing at the origin of the left ICA, with irregularity in the proximal left ICA and possible linear filling defect in the mid left ICA (series 15, image 164). The distal left ICA is patent. Additional severe narrowing at the origin of the left ECA (series 15, image 190), which is otherwise patent. The CCA is unremarkable. Vertebral arteries: Noncalcified plaque or thrombus narrows the left subclavian artery (series 15, image 279), although this is not hemodynamically significant. 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 The left vertebral artery is otherwise patent to the skull base. Moderate stenosis in the right V1 (series 15, image 247). The right vertebral artery  is otherwise patent to the skull base. Skeleton: No acute osseous abnormality. Degenerative changes in the cervical spine. Other neck: 1 cm hypoenhancing nodule in the left thyroid lobe, for which no follow-up is currently indicated. (Reference: J Am Coll Radiol. 2015 Feb;12(2): 143-50) Upper chest: No focal pulmonary opacity or pleural effusion. Review of the MIP images confirms the above findings CTA HEAD FINDINGS Anterior circulation: Minimal opacification of right ICA until the supraclinoid segment, where there is severe calcification, after which the right ICA is patent and normal in caliber. Moderate narrowing of the proximal left cavernous ICA (series 15, image 110) and mild stenosis in the distal left cavernous ICA. Moderate stenosis in the proximal left supraclinoid ICA. The left ICA is patent to terminus. A1 segments patent. Normal anterior communicating artery. Anterior cerebral arteries are patent to their distal aspects without significant stenosis. No M1 stenosis or occlusion. MCA branches perfused to their distal aspects without significant stenosis. Posterior circulation: Vertebral arteries patent to the vertebrobasilar junction without significant stenosis. Posterior inferior cerebellar arteries patent proximally. Basilar patent to its distal aspect without significant stenosis. Superior cerebellar arteries patent proximally. Patent P1 segments. PCAs perfused to their distal aspects without significant stenosis. The bilateral  posterior communicating arteries are not visualized. Venous sinuses: As permitted by contrast timing, patent. Anatomic variants: None significant. No evidence of aneurysm or vascular malformation. Review of the MIP images confirms the above findings IMPRESSION: 1. Near complete nonopacification of the right ICA just distal to its origin, with an appearance concerning for dissection versus extensive thrombosis. Minimal flow is seen throughout the remainder of the cervical ICA,  with reconstitution in the mid supraclinoid ICA, just distal to an area of focal calcification. 2. At least 75% narrowing at the origin of the left ICA, similarly concerning for thrombosis versus dissection, with irregularity in the proximal left ICA and possible linear filling defect in the mid left ICA, which could also represent dissection. Moderate narrowing in the left cavernous and supraclinoid ICA. 3. Severe narrowing at the origin of the left vertebral artery and in the left V1. Moderate stenosis in the right V1. 4. Severe narrowing at the origin of the bilateral ECAs, with additional multifocal narrowing in 1 of the right ECA branches. 5. Hypodensity in the right body of the corpus callosum, best seen on the coronal images, which correlates with the acute infarct noted on the 02/24/2023 MRI. Additional smaller foci of acute infarct in the periventricular white matter, right temporal lobe, and right parietal lobe are not well seen on this exam, which may be due to motion. Code stroke imaging results were communicated on 02/25/2023 at 2:20 pm to provider BHAGAT via secure text paging. Electronically Signed   By: Donald Campion M.D.   On: 02/25/2023 14:20   ECHOCARDIOGRAM COMPLETE Result Date: 02/25/2023    ECHOCARDIOGRAM REPORT   Patient Name:   Angel Gray Date of Exam: 02/25/2023 Medical Rec #:  [id number]       Height:       61.0 in Accession #:    [id number]      Weight:       196.8 lb Date of Birth:  1981-01-06       BSA:          1.876 m Patient Age:    42 years        BP:           145/71 mmHg Patient Gender: F               HR:           83 bpm. Exam Location:  [facility] Procedure: 2D Echo, Cardiac Doppler and Color Doppler Indications:     Stroke  History:         Patient has no prior history of Echocardiogram examinations.                  Stroke; Risk Factors:Hypertension, Diabetes and Dyslipidemia.  Sonographer:     Naomie Reef Referring Phys:  [id number] OLADAPO ADEFESO Diagnosing Phys: Deatrice Cage MD  Sonographer Comments: Image acquisition challenging due to respiratory motion. IMPRESSIONS  1. Left ventricular ejection fraction, by estimation, is 55 to 60%. The left ventricle has normal function. The left ventricle has no regional wall motion abnormalities. Left ventricular diastolic parameters are consistent with Grade I diastolic dysfunction (impaired relaxation).  2. Right ventricular systolic function is normal. The right ventricular size is normal.  3. The mitral valve is normal in structure. No evidence of mitral valve regurgitation. No evidence of mitral stenosis.  4. The aortic valve is normal in structure. Aortic valve regurgitation is not visualized. No aortic stenosis is present.  5. The inferior vena  cava is normal in size with greater than 50% respiratory variability, suggesting right atrial pressure of 3 mmHg.  6. The interatrial septum is aneurysmal. No atrial level shunt detected by color flow Doppler. FINDINGS  Left Ventricle: Left ventricular ejection fraction, by estimation, is 55 to 60%. The left ventricle has normal function. The left ventricle has no regional wall motion abnormalities. The left ventricular internal cavity size was normal in size. There is  no left ventricular hypertrophy. Left ventricular diastolic parameters are consistent with Grade I diastolic dysfunction (impaired relaxation). Right Ventricle: The right ventricular size is normal. No increase in right ventricular wall thickness. Right ventricular systolic function is normal. Left Atrium: Left atrial size was normal in size. Right Atrium: Right atrial size was normal in size. Pericardium: There is no evidence of pericardial effusion. Mitral Valve: The mitral valve is normal in structure. No evidence of mitral valve regurgitation. No evidence of mitral valve stenosis. MV peak gradient, 5.2 mmHg. The mean mitral valve gradient is 2.0 mmHg. Tricuspid Valve: The tricuspid valve is normal in structure. Tricuspid  valve regurgitation is not demonstrated. No evidence of tricuspid stenosis. Aortic Valve: The aortic valve is normal in structure. Aortic valve regurgitation is not visualized. No aortic stenosis is present. Aortic valve mean gradient measures 4.0 mmHg. Aortic valve peak gradient measures 8.0 mmHg. Aortic valve area, by VTI measures 2.56 cm. Pulmonic Valve: The pulmonic valve was normal in structure. Pulmonic valve regurgitation is trivial. No evidence of pulmonic stenosis. Aorta: The aortic root is normal in size and structure. Venous: The inferior vena cava is normal in size with greater than 50% respiratory variability, suggesting right atrial pressure of 3 mmHg. IAS/Shunts: The interatrial septum is aneurysmal. No atrial level shunt detected by color flow Doppler.  LEFT VENTRICLE PLAX 2D LVIDd:         4.00 cm   Diastology LVIDs:         3.00 cm   LV e' medial:    0.04 cm/s LV PW:         1.20 cm   LV E/e' medial:  17.4 LV IVS:        1.00 cm   LV e' lateral:   0.06 cm/s LVOT diam:     2.00 cm   LV E/e' lateral: 10.3 LV SV:         71 LV SV Index:   38 LVOT Area:     3.14 cm  RIGHT VENTRICLE RV Basal diam:  2.60 cm RV Mid diam:    2.30 cm RV S prime:     11.70 cm/s TAPSE (M-mode): 2.2 cm LEFT ATRIUM             Index        RIGHT ATRIUM           Index LA diam:        3.60 cm 1.92 cm/m   RA Area:     11.10 cm LA Vol (A2C):   33.2 ml 17.70 ml/m  RA Volume:   23.00 ml  12.26 ml/m LA Vol (A4C):   41.2 ml 21.96 ml/m LA Biplane Vol: 39.9 ml 21.27 ml/m  AORTIC VALVE                    PULMONIC VALVE AV Area (Vmax):    2.45 cm     PV Vmax:       0.96 m/s AV Area (Vmean):   2.39 cm  PV Peak grad:  3.7 mmHg AV Area (VTI):     2.56 cm AV Vmax:           141.00 cm/s AV Vmean:          90.500 cm/s AV VTI:            0.279 m AV Peak Grad:      8.0 mmHg AV Mean Grad:      4.0 mmHg LVOT Vmax:         110.00 cm/s LVOT Vmean:        68.900 cm/s LVOT VTI:          0.227 m LVOT/AV VTI ratio: 0.81  AORTA Ao Root diam:  2.80 cm MITRAL VALVE MV Area (PHT): 3.99 cm    SHUNTS MV Area VTI:   2.58 cm    Systemic VTI:  0.23 m MV Peak grad:  5.2 mmHg    Systemic Diam: 2.00 cm MV Mean grad:  2.0 mmHg MV Vmax:       1.14 m/s MV Vmean:      63.3 cm/s MV Decel Time: 190 msec MV E velocity: 0.66 cm/s MV A velocity: 96.40 cm/s MV E/A ratio:  0.01 Deatrice Cage MD Electronically signed by Deatrice Cage MD Signature Date/Time: 02/25/2023/12:10:46 PM    Final    MR BRAIN WO CONTRAST Result Date: 02/24/2023 CLINICAL DATA:  eval cva. slurred speech and facial droop EXAM: MRI HEAD WITHOUT CONTRAST TECHNIQUE: Multiplanar, multiecho pulse sequences of the brain and surrounding structures were obtained without intravenous contrast. COMPARISON:  CT head. FINDINGS: Brain: Multiple small acute infarcts in the right frontal lobe and right parietal lobe. Additional small acute infarct in the medial right temporal lobe and infarct in the body of the corpus callosum. Edema without substantial mass effect. Remote left frontal cortical infarct. Mild to moderate scattered T2/FLAIR hyperintensities not matter compatible with chronic microvascular disease. Vascular: Major arterial flow voids are maintained. Skull and upper cervical spine: Normal marrow signal. Sinuses/Orbits: Moderate paranasal sinus mucosal thickening. No acute orbital findings. Other: No sizable mastoid effusions. 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 Electronically Signed   By: Gilmore GORMAN Molt M.D.   On: 02/24/2023 23:21   CT HEAD CODE STROKE WO CONTRAST Result Date: 02/24/2023 CLINICAL DATA:  Code stroke.  Slurred speech and facial droop EXAM: CT HEAD WITHOUT CONTRAST TECHNIQUE: Contiguous axial images were obtained from the base of the skull through the vertex without intravenous contrast. RADIATION DOSE REDUCTION: This exam was performed according to the departmental dose-optimization program which  includes automated exposure control, adjustment of the mA and/or kV according to patient size and/or use of iterative reconstruction technique. COMPARISON:  None Available. FINDINGS: Brain: There is no mass, hemorrhage or extra-axial collection. The size and configuration of the ventricles and extra-axial CSF spaces are normal. The brain parenchyma is normal, without evidence of acute or chronic infarction. Incidental partially empty sella turcica. Vascular: No abnormal hyperdensity of the major intracranial arteries or dural venous sinuses. No intracranial atherosclerosis. Skull: The visualized skull base, calvarium and extracranial soft tissues are normal. Sinuses/Orbits: No fluid levels or advanced mucosal thickening of the visualized paranasal sinuses. No mastoid or middle ear effusion. The orbits are normal. ASPECTS [facility] Stroke Program Early CT Score) - Ganglionic level infarction (caudate, lentiform nuclei, internal capsule, insula, M1-M3 cortex): 7 - Supraganglionic infarction (M4-M6 cortex): 3 Total score (0-10 with 10 being normal): 10  IMPRESSION: 1. No acute intracranial abnormality. 2. ASPECTS is 10. These results were called by telephone at the time of interpretation on 02/24/2023 at 9:44 pm to provider [facility] , who verbally acknowledged these results. Electronically Signed   By: Franky Stanford M.D.   On: 02/24/2023 21:44    Microbiology: Results for orders placed or performed during the hospital encounter of 02/24/23  Urine Culture     Status: Abnormal (Preliminary result)   Collection Time: 02/24/23 10:05 PM   Specimen: Urine, Random  Result Value Ref Range Status   Specimen Description   Final    URINE, RANDOM Performed at [facility], [address], [place], [place] [address]    Special Requests   Final    NONE Performed at [facility], [address], [place], [place] [address]    Culture (A)  Final    >=100,000 COLONIES/mL VONNE NEGATIVE  RODS SUSCEPTIBILITIES TO FOLLOW Performed at [facility], [address], [place], [place] [address]    Report Status PENDING  Incomplete    Labs: CBC: Recent Labs  Lab 02/24/23 2123 02/25/23 1241 02/26/23 0901  WBC 7.7 6.9 9.3  NEUTROABS 3.1  --   --   HGB 12.6 11.5* 11.7*  HCT 38.7 34.6* 36.9  MCV 83.2 82.6 83.1  PLT 220 226 262   Basic Metabolic Panel: Recent Labs  Lab 02/24/23 2123 02/25/23 0551 02/26/23 0901  NA 138 137 133*  K 5.1 4.9 4.4  CL 104 104 98  CO2 23 22 24   GLUCOSE 162* 245* 402*  BUN 23* 21* 26*  CREATININE 0.86 0.75 0.90  CALCIUM  8.8* 8.3* 8.7*  MG  --  1.9  --   PHOS  --  3.9  --    Liver Function Tests: Recent Labs  Lab 02/24/23 2123 02/25/23 0551  AST 50* 33  ALT 53* 45*  ALKPHOS 144* 128*  BILITOT 0.6 0.4  PROT 7.8 7.1  ALBUMIN 3.3* 2.9*   CBG: Recent Labs  Lab 02/25/23 2130 02/26/23 0758 02/26/23 0908 02/26/23 0959 02/26/23 1110  GLUCAP 262* 413* 390* 435* 396*    Discharge time spent: greater than 30 minutes.  This record has been created using Conservation officer, historic buildings. Errors have been sought and corrected,but may not always be located. Such creation errors do not reflect on the standard of care.   Signed: Amaryllis Dare, MD [facility] 02/26/2023

## 2023-02-27 LAB — URINE CULTURE: Culture: >=100000 — AB

## 2023-03-05 ENCOUNTER — Ambulatory Visit (INDEPENDENT_AMBULATORY_CARE_PROVIDER_SITE_OTHER): Payer: 59 | Admitting: Vascular Surgery

## 2023-03-05 ENCOUNTER — Encounter (INDEPENDENT_AMBULATORY_CARE_PROVIDER_SITE_OTHER): Payer: Self-pay | Admitting: Vascular Surgery

## 2023-03-05 VITALS — BP 172/89 | HR 98 | Resp 18 | Ht 61.0 in | Wt 178.0 lb

## 2023-03-05 DIAGNOSIS — I639 Cerebral infarction, unspecified: Secondary | ICD-10-CM

## 2023-03-05 DIAGNOSIS — I6523 Occlusion and stenosis of bilateral carotid arteries: Secondary | ICD-10-CM

## 2023-03-05 DIAGNOSIS — I10 Essential (primary) hypertension: Secondary | ICD-10-CM

## 2023-03-05 DIAGNOSIS — E782 Mixed hyperlipidemia: Secondary | ICD-10-CM | POA: Diagnosis not present

## 2023-03-05 NOTE — Assessment & Plan Note (Signed)
blood glucose control important in reducing the progression of atherosclerotic disease. Also, involved in wound healing. On appropriate medications.

## 2023-03-05 NOTE — Assessment & Plan Note (Signed)
Treatment as above

## 2023-03-05 NOTE — Progress Notes (Signed)
MRN : [id number]  Angel Gray is a 43 y.o. (October 26, 1980) female who presents with chief complaint of  Chief Complaint  Patient presents with   Follow-up    Follow up 1 week  .  History of Present Illness: Patient returns today in follow up of carotid dissection and recent stroke.  She was seen in the hospital after several days of neurologic symptoms and found to have acute right sided stroke on a MRI study.  She also underwent a given the she has been treated with dual antiplatelet therapy since her discharge as well as high-dose statin agent.  She has had no further progression of her neurologic symptoms.  She remains somewhat fixated on going back to work despite not only her recent stroke as well as recently being diagnosed with the flu.  Current Outpatient Medications  Medication Sig Dispense Refill   aspirin EC 81 MG tablet Take 1 tablet (81 mg total) by mouth daily. Swallow whole. 30 tablet 12   clopidogrel (PLAVIX) 75 MG tablet Take 1 tablet (75 mg total) by mouth daily. 30 tablet 2   hydrochlorothiazide (HYDRODIURIL) 25 MG tablet Take 25 mg by mouth daily.     insulin lispro (HUMALOG) 100 UNIT/ML injection Inject 100 Units into the skin as directed. 100 units per day loaded into insulin pump.     rosuvastatin (CRESTOR) 40 MG tablet Take 1 tablet (40 mg total) by mouth daily. 90 tablet 1   TOUJEO SOLOSTAR 300 UNIT/ML Solostar Pen Inject 36 Units into the skin daily.     valsartan (DIOVAN) 320 MG tablet Take 320 mg by mouth daily.     No current facility-administered medications for this visit.    Past Medical History:  Diagnosis Date   Diabetes mellitus without complication (HCC)   Hypertension Hyperlipidemia stroke  Past Surgical History:  Procedure Laterality Date   TOE AMPUTATION       Social History   Tobacco Use   Smoking status: Never   Smokeless tobacco: Never  Substance Use Topics   Alcohol use: Not Currently  No IVDU   Family History No known  history of clotting disorders, bleeding disorders, autoimmune diseases or aneurysms  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 Vascular:  [] Pain in legs with walking   [] Pain in legs at rest   [] Pain in legs when laying flat   [] Claudication   [] Pain in feet when walking  [] Pain in feet at rest  [] Pain in feet when laying flat   [] History of DVT   [] Phlebitis   [] Swelling in legs   [] Varicose veins   [] Non-healing ulcers Pulmonary:   [] Uses home oxygen   [] Productive cough   [] Hemoptysis   [] Wheeze  [] COPD   [] Asthma Neurologic:  [] Dizziness  [] Blackouts   [] Seizures   [x] History of stroke   [] History of TIA  [] Aphasia   [] Temporary blindness   [] Dysphagia   [] Weakness or numbness in arms   [] Weakness or numbness  in legs Musculoskeletal:  [] Arthritis   [] Joint swelling   [] Joint pain   [] Low back pain Hematologic:  [] Easy bruising  [] Easy bleeding   [] Hypercoagulable state   [] Anemic   Gastrointestinal:  [] Blood in stool   [] Vomiting blood  [] Gastroesophageal reflux/heartburn   [] Abdominal pain Genitourinary:  [] Chronic kidney disease   [] Difficult urination  [] Frequent urination  [] Burning with urination   [] Hematuria Skin:  [] Rashes   [] Ulcers   [] Wounds Psychological:  [] History of anxiety   []  History of major depression.  Physical Examination  BP (!) 172/89   Pulse 98   Resp 18   Ht 5\' 1"  (1.549 m)   Wt 178 lb (80.7 kg)   BMI 33.63 kg/m  Gen:  WD/WN, NAD Head: /AT, No temporalis wasting. Ear/Nose/Throat:  Hearing grossly intact, nares w/o erythema or drainage Eyes: Conjunctiva clear. Sclera non-icteric Neck: Supple.  Trachea midline Pulmonary:  Good air movement, no use of accessory muscles.  Cardiac: RRR, no JVD Vascular:  Vessel Right Left  Radial Palpable Palpable           Musculoskeletal: M/S 5/5 throughout.  No deformity or atrophy. No edema. Neurologic: Sensation grossly intact in extremities.  Symmetrical.  Speech is fluent.  Psychiatric: Judgment intact, Mood & affect appropriate for pt's clinical situation. Dermatologic: No rashes or ulcers noted.  No cellulitis or open wounds.      Labs Recent Results (from the past 2160 hours)  CBG monitoring, ED     Status: Abnormal   Collection Time: 02/24/23  9:21 PM  Result Value Ref Range   Glucose-Capillary 153 (H) 70 - 99 mg/dL    Comment: Glucose reference range applies only to samples taken after fasting for at least 8 hours.  Ethanol     Status: None   Collection Time: 02/24/23  9:23 PM  Result Value Ref Range   Alcohol, Ethyl (B) <10 <10 mg/dL    Comment: (NOTE) Lowest detectable limit for serum alcohol is 10 mg/dL.  For medical purposes only. Performed at [facility], [address] [place], [place] [address]   Protime-INR     Status: None   Collection Time: 02/24/23  9:23 PM  Result Value Ref Range   Prothrombin Time 12.8 11.4 - 15.2 seconds   INR 0.9 0.8 - 1.2    Comment: (NOTE) INR goal varies based on device and disease states. Performed at [facility], [address] [place], [place] [address]   APTT     Status: None   Collection Time: 02/24/23  9:23 PM  Result Value Ref Range   aPTT 25 24 - 36 seconds    Comment: Performed at [facility], [address] [place], [place] [address]  CBC     Status: None   Collection Time: 02/24/23  9:23 PM  Result Value Ref Range   WBC 7.7 4.0 - 10.5 K/uL   RBC 4.65 3.87 - 5.11 MIL/uL   Hemoglobin 12.6 12.0 - 15.0 g/dL   HCT 91.4  78.2 - 95.6 %   MCV 83.2 80.0 - 100.0 fL   MCH 27.1 26.0 - 34.0 pg   MCHC 32.6 30.0 - 36.0 g/dL   RDW 21.3 08.6 - 57.8 %   Platelets 220 150 - 400 K/uL   nRBC 0.0 0.0 - 0.2 %    Comment: Performed at [facility], [address] [place], [place] [address]  Differential     Status: None   Collection Time: 02/24/23  9:23 PM  Result Value Ref Range   Neutrophils Relative % 40 %   Neutro Abs 3.1 1.7 - 7.7 K/uL   Lymphocytes Relative 49 %   Lymphs Abs 3.7 0.7 - 4.0 K/uL   Monocytes Relative 7 %   Monocytes Absolute 0.5 0.1 - 1.0 K/uL   Eosinophils Relative 4 %   Eosinophils Absolute 0.3 0.0 - 0.5 K/uL   Basophils Relative 0 %   Basophils Absolute 0.0 0.0 - 0.1 K/uL   Immature Granulocytes 0 %   Abs Immature Granulocytes 0.02 0.00 - 0.07 K/uL    Comment: Performed at [facility], [address] [place], [place] [address]  Comprehensive metabolic panel     Status: Abnormal   Collection Time: 02/24/23  9:23 PM  Result Value Ref Range   Sodium 138 135 - 145 mmol/L   Potassium 5.1 3.5 - 5.1 mmol/L   Chloride 104 98 - 111 mmol/L   CO2 23 22 - 32 mmol/L   Glucose, Bld 162 (H) 70 - 99 mg/dL    Comment: Glucose reference range applies only to samples taken after fasting for at least 8 hours.   BUN 23 (H) 6 - 20 mg/dL   Creatinine, Ser 6.28 0.44 - 1.00 mg/dL   Calcium 8.8 (L) 8.9 - 10.3 mg/dL   Total Protein 7.8 6.5 - 8.1 g/dL   Albumin 3.3 (L) 3.5 - 5.0 g/dL   AST 50 (H) 15 - 41 U/L   ALT 53 (H) 0 - 44 U/L   Alkaline Phosphatase 144 (H) 38 - 126 U/L   Total Bilirubin 0.6 0.0 - 1.2 mg/dL   GFR, Estimated >31 >51 mL/min    Comment: (NOTE) Calculated using the CKD-EPI Creatinine Equation (2021)    Anion gap 11 5 - 15    Comment: Performed at [facility], [address] [place], [place] [address]  Hemoglobin A1c     Status: Abnormal   Collection Time: 02/24/23  9:23 PM  Result Value Ref Range   Hgb A1c MFr Bld 8.1 (H) 4.8 - 5.6 %    Comment:  (NOTE) Pre diabetes:          5.7%-6.4%  Diabetes:              >6.4%  Glycemic control for   <7.0% adults with diabetes    Mean Plasma Glucose 185.77 mg/dL    Comment: Performed at [facility], [address] [place], [place] [address]  Urine Drug Screen, Qualitative     Status: None   Collection Time: 02/24/23 10:05 PM  Result Value Ref Range   Tricyclic, Ur Screen NONE DETECTED NONE DETECTED   Amphetamines, Ur Screen NONE DETECTED NONE DETECTED   MDMA (Ecstasy)Ur Screen NONE DETECTED NONE DETECTED   Cocaine Metabolite,Ur Markle NONE DETECTED NONE DETECTED   Opiate, Ur Screen NONE DETECTED NONE DETECTED   Phencyclidine (PCP) Ur S NONE DETECTED NONE DETECTED   Cannabinoid 50 Ng, Ur [place] NONE DETECTED NONE DETECTED   Barbiturates, Ur Screen NONE DETECTED NONE DETECTED   Benzodiazepine, Ur Scrn NONE DETECTED NONE DETECTED   Methadone Scn, Ur NONE DETECTED NONE DETECTED    Comment: (NOTE) Tricyclics + metabolites, urine    Cutoff 1000 ng/mL Amphetamines + metabolites, urine  Cutoff 1000 ng/mL MDMA (Ecstasy), urine              Cutoff 500 ng/mL Cocaine Metabolite, urine          Cutoff 300  ng/mL Opiate + metabolites, urine        Cutoff 300 ng/mL Phencyclidine (PCP), urine         Cutoff 25 ng/mL Cannabinoid, urine                 Cutoff 50 ng/mL Barbiturates + metabolites, urine  Cutoff 200 ng/mL Benzodiazepine, urine              Cutoff 200 ng/mL Methadone, urine                   Cutoff 300 ng/mL  The urine drug screen provides only a preliminary, unconfirmed analytical test result and should not be used for non-medical purposes. Clinical consideration and professional judgment should be applied to any positive drug screen result due to possible interfering substances. A more specific alternate chemical method must be used in order to obtain a confirmed analytical result. Gas chromatography / mass spectrometry (GC/MS) is the preferred confirm atory method. Performed at  [facility], [address] [place], [place] [address]   Urinalysis, Routine w reflex microscopic -Urine, Clean Catch     Status: Abnormal   Collection Time: 02/24/23 10:05 PM  Result Value Ref Range   Color, Urine YELLOW (A) YELLOW   APPearance HAZY (A) CLEAR   Specific Gravity, Urine 1.012 1.005 - 1.030   pH 5.0 5.0 - 8.0   Glucose, UA NEGATIVE NEGATIVE mg/dL   Hgb urine dipstick SMALL (A) NEGATIVE   Bilirubin Urine NEGATIVE NEGATIVE   Ketones, ur NEGATIVE NEGATIVE mg/dL   Protein, ur 604 (A) NEGATIVE mg/dL   Nitrite POSITIVE (A) NEGATIVE   Leukocytes,Ua TRACE (A) NEGATIVE   RBC / HPF 0-5 0 - 5 RBC/hpf   WBC, UA 21-50 0 - 5 WBC/hpf   Bacteria, UA MANY (A) NONE SEEN   Squamous Epithelial / HPF 0-5 0 - 5 /HPF   Mucus PRESENT    Hyaline Casts, UA PRESENT     Comment: Performed at [facility], [address] [place], [place] [address]  Urine Culture     Status: Abnormal   Collection Time: 02/24/23 10:05 PM   Specimen: Urine, Random  Result Value Ref Range   Specimen Description      URINE, RANDOM Performed at [facility], [address] [place], [place] [address]    Special Requests      NONE Performed at [facility], [address] [place], [place] [address]    Culture >=100,000 COLONIES/mL ESCHERICHIA COLI (A)    Report Status 02/27/2023 FINAL    Organism ID, Bacteria ESCHERICHIA COLI (A)       Susceptibility   Escherichia coli - MIC*    AMPICILLIN >=32 RESISTANT Resistant     CEFAZOLIN >=64 RESISTANT Resistant     CEFEPIME <=0.12 SENSITIVE Sensitive     CEFTRIAXONE <=0.25 SENSITIVE Sensitive     CIPROFLOXACIN <=0.25 SENSITIVE Sensitive     GENTAMICIN <=1 SENSITIVE Sensitive     IMIPENEM 0.5 SENSITIVE Sensitive     NITROFURANTOIN <=16 SENSITIVE Sensitive     TRIMETH/SULFA <=20 SENSITIVE Sensitive     AMPICILLIN/SULBACTAM >=32 RESISTANT Resistant     PIP/TAZO 8 SENSITIVE Sensitive ug/mL    * >=100,000 COLONIES/mL  ESCHERICHIA COLI  Lipid panel     Status: Abnormal   Collection Time: 02/25/23  5:51 AM  Result Value Ref Range   Cholesterol 141 0 - 200 mg/dL   Triglycerides 621 (H) <150 mg/dL   HDL 27 (  L) >40 mg/dL   Total CHOL/HDL Ratio 5.2 RATIO   VLDL 34 0 - 40 mg/dL   LDL Cholesterol 80 0 - 99 mg/dL    Comment:        Total Cholesterol/HDL:CHD Risk Coronary Heart Disease Risk Table                     Men   Women  1/2 Average Risk   3.4   3.3  Average Risk       5.0   4.4  2 X Average Risk   9.6   7.1  3 X Average Risk  23.4   11.0        Use the calculated Patient Ratio above and the CHD Risk Table to determine the patient's CHD Risk.        ATP III CLASSIFICATION (LDL):  <100     mg/dL   Optimal  161-096  mg/dL   Near or Above                    Optimal  130-159  mg/dL   Borderline  045-409  mg/dL   High  >811     mg/dL   Very High Performed at [facility], [address] [place], [place] [address]   HIV Antibody (routine testing w rflx)     Status: None   Collection Time: 02/25/23  5:51 AM  Result Value Ref Range   HIV Screen 4th Generation wRfx Non Reactive Non Reactive    Comment: Performed at [facility], [address] [place], [place] [address]  Comprehensive metabolic panel     Status: Abnormal   Collection Time: 02/25/23  5:51 AM  Result Value Ref Range   Sodium 137 135 - 145 mmol/L   Potassium 4.9 3.5 - 5.1 mmol/L   Chloride 104 98 - 111 mmol/L   CO2 22 22 - 32 mmol/L   Glucose, Bld 245 (H) 70 - 99 mg/dL    Comment: Glucose reference range applies only to samples taken after fasting for at least 8 hours.   BUN 21 (H) 6 - 20 mg/dL   Creatinine, Ser 1.30 0.44 - 1.00 mg/dL   Calcium 8.3 (L) 8.9 - 10.3 mg/dL   Total Protein 7.1 6.5 - 8.1 g/dL   Albumin 2.9 (L) 3.5 - 5.0 g/dL   AST 33 15 - 41 U/L   ALT 45 (H) 0 - 44 U/L   Alkaline Phosphatase 128 (H) 38 - 126 U/L   Total Bilirubin 0.4 0.0 - 1.2 mg/dL   GFR, Estimated >86 >57 mL/min    Comment:  (NOTE) Calculated using the CKD-EPI Creatinine Equation (2021)    Anion gap 11 5 - 15    Comment: Performed at [facility], [address], [place], [place] [address]  Magnesium     Status: None   Collection Time: 02/25/23  5:51 AM  Result Value Ref Range   Magnesium 1.9 1.7 - 2.4 mg/dL    Comment: Performed at [facility], [address], [place], [place] [address]  Phosphorus     Status: None   Collection Time: 02/25/23  5:51 AM  Result Value Ref Range   Phosphorus 3.9 2.5 - 4.6 mg/dL    Comment: Performed at [facility], [address] [place], [place] [address]  CBG monitoring, ED     Status: Abnormal   Collection Time: 02/25/23  7:26 AM  Result  Value Ref Range   Glucose-Capillary 226 (H) 70 - 99 mg/dL    Comment: Glucose reference range applies only to samples taken after fasting for at least 8 hours.  ECHOCARDIOGRAM COMPLETE     Status: None   Collection Time: 02/25/23 11:00 AM  Result Value Ref Range   Weight 3,148.8 oz   Height 61 in   BP 127/64 mmHg   Ao pk vel 1.41 m/s   AV Area VTI 2.56 cm2   AR max vel 2.45 cm2   AV Mean grad 4.0 mmHg   AV Peak grad 8.0 mmHg   S' Lateral 3.00 cm   AV Area mean vel 2.39 cm2   Area-P 1/2 3.99 cm2   MV VTI 2.58 cm2   Est EF 55 - 60%   CBG monitoring, ED     Status: Abnormal   Collection Time: 02/25/23 12:36 PM  Result Value Ref Range   Glucose-Capillary 316 (H) 70 - 99 mg/dL    Comment: Glucose reference range applies only to samples taken after fasting for at least 8 hours.   Comment 1 Notify RN    Comment 2 Document in Chart   CBC     Status: Abnormal   Collection Time: 02/25/23 12:41 PM  Result Value Ref Range   WBC 6.9 4.0 - 10.5 K/uL   RBC 4.19 3.87 - 5.11 MIL/uL   Hemoglobin 11.5 (L) 12.0 - 15.0 g/dL   HCT 16.1 (L) 09.6 - 04.5 %   MCV 82.6 80.0 - 100.0 fL   MCH 27.4 26.0 - 34.0 pg   MCHC 33.2 30.0 - 36.0 g/dL   RDW 40.9 81.1 - 91.4 %   Platelets 226 150 - 400 K/uL   nRBC 0.0 0.0 -  0.2 %    Comment: Performed at [facility], [address] [place], [place] [address]  CBG monitoring, ED     Status: Abnormal   Collection Time: 02/25/23  4:37 PM  Result Value Ref Range   Glucose-Capillary 271 (H) 70 - 99 mg/dL    Comment: Glucose reference range applies only to samples taken after fasting for at least 8 hours.   Comment 1 Notify RN    Comment 2 Document in Chart   CBG monitoring, ED     Status: Abnormal   Collection Time: 02/25/23  9:30 PM  Result Value Ref Range   Glucose-Capillary 262 (H) 70 - 99 mg/dL    Comment: Glucose reference range applies only to samples taken after fasting for at least 8 hours.  CBG monitoring, ED     Status: Abnormal   Collection Time: 02/26/23  7:58 AM  Result Value Ref Range   Glucose-Capillary 413 (H) 70 - 99 mg/dL    Comment: Glucose reference range applies only to samples taken after fasting for at least 8 hours.  Basic metabolic panel     Status: Abnormal   Collection Time: 02/26/23  9:01 AM  Result Value Ref Range   Sodium 133 (L) 135 - 145 mmol/L   Potassium 4.4 3.5 - 5.1 mmol/L   Chloride 98 98 - 111 mmol/L   CO2 24 22 - 32 mmol/L   Glucose, Bld 402 (H) 70 - 99 mg/dL    Comment: Glucose reference range applies only to samples taken after fasting for at least 8 hours.   BUN 26 (H) 6 - 20 mg/dL   Creatinine, Ser 6.21 0.44 - 1.00 mg/dL   Calcium 8.7 (L) 8.9 - 10.3 mg/dL  GFR, Estimated >60 >60 mL/min    Comment: (NOTE) Calculated using the CKD-EPI Creatinine Equation (2021)    Anion gap 11 5 - 15    Comment: Performed at [facility], [address] [place], [place] [address]  CBC     Status: Abnormal   Collection Time: 02/26/23  9:01 AM  Result Value Ref Range   WBC 9.3 4.0 - 10.5 K/uL   RBC 4.44 3.87 - 5.11 MIL/uL   Hemoglobin 11.7 (L) 12.0 - 15.0 g/dL   HCT 60.4 54.0 - 98.1 %   MCV 83.1 80.0 - 100.0 fL   MCH 26.4 26.0 - 34.0 pg   MCHC 31.7 30.0 - 36.0 g/dL   RDW 19.1 47.8 - 29.5 %    Platelets 262 150 - 400 K/uL   nRBC 0.0 0.0 - 0.2 %    Comment: Performed at [facility], [address] [place], [place] [address]  CBG monitoring, ED     Status: Abnormal   Collection Time: 02/26/23  9:08 AM  Result Value Ref Range   Glucose-Capillary 390 (H) 70 - 99 mg/dL    Comment: Glucose reference range applies only to samples taken after fasting for at least 8 hours.  CBG monitoring, ED     Status: Abnormal   Collection Time: 02/26/23  9:59 AM  Result Value Ref Range   Glucose-Capillary 435 (H) 70 - 99 mg/dL    Comment: Glucose reference range applies only to samples taken after fasting for at least 8 hours.  CBG monitoring, ED     Status: Abnormal   Collection Time: 02/26/23 11:10 AM  Result Value Ref Range   Glucose-Capillary 396 (H) 70 - 99 mg/dL    Comment: Glucose reference range applies only to samples taken after fasting for at least 8 hours.  CBG monitoring, ED     Status: Abnormal   Collection Time: 02/26/23 12:40 PM  Result Value Ref Range   Glucose-Capillary 257 (H) 70 - 99 mg/dL    Comment: Glucose reference range applies only to samples taken after fasting for at least 8 hours.    Radiology CT ANGIO HEAD NECK W WO CM Result Date: 02/25/2023 CLINICAL DATA:  Left-sided facial droop, generalized weakness for a week EXAM: CT ANGIOGRAPHY HEAD AND NECK WITH AND WITHOUT CONTRAST TECHNIQUE: Multidetector CT imaging of the head and neck was performed using the standard protocol during bolus administration of intravenous contrast. Multiplanar CT image reconstructions and MIPs were obtained to evaluate the vascular anatomy. Carotid stenosis measurements (when applicable) are obtained utilizing NASCET criteria, using the distal internal carotid diameter as the denominator. RADIATION DOSE REDUCTION: This exam was performed according to the departmental dose-optimization program which includes automated exposure control, adjustment of the mA and/or kV according to patient  size and/or use of iterative reconstruction technique. CONTRAST:  75mL OMNIPAQUE IOHEXOL 350 MG/ML SOLN COMPARISON:  02/24/2023 CT head, 02/24/2023 MRI FINDINGS: CT HEAD FINDINGS Evaluation is somewhat limited by motion artifact. Brain: Hypodensity in the right body of the corpus callosum, best seen on the coronal images, correlates with the acute infarct noted on the 02/24/2023 MRI. Additional smaller foci of acute infarct in the periventricular white matter, right temporal lobe, and right parietal lobe are not well seen on this exam. No evidence of additional acute infarct, hemorrhage, mass, mass effect, or midline shift. No hydrocephalus or extra-axial fluid collection. Vascular: Please see CTA findings below. Skull: Negative for fracture or focal lesion. Sinuses/Orbits: Mucosal thickening in the  ethmoid air cells. No acute finding in the orbits. CTA NECK FINDINGS Evaluation is somewhat limited by motion artifact. Aortic arch: Two-vessel arch with a common origin of the brachiocephalic and left common carotid arteries. Imaged portion shows no evidence of aneurysm or dissection. No significant stenosis of the major arch vessel origins. Right carotid system: Near complete nonopacification of the left ICA just distal to its origin (series 15, image 187), with an appearance concerning for dissection versus extensive thrombosis. Minimal flow is seen throughout the remainder of the cervical ICA (series 15, image 145, for example), with reconstitution mid supraclinoid ICA. There is also severe narrowing at the origin of right ECA, which appears otherwise patent. The CCA is patent. One of the right PCA branches also demonstrates multifocal narrowing (series 15, images 144 and 138). Left carotid system: At least 75% narrowing at the origin of the left ICA, with irregularity in the proximal left ICA and possible linear filling defect in the mid left ICA (series 15, image 164). The distal left ICA is patent. Additional  severe narrowing at the origin of the left ECA (series 15, image 190), which is otherwise patent. The CCA is unremarkable. Vertebral arteries: Noncalcified plaque or thrombus narrows the left subclavian artery (series 15, image 279), although this is not hemodynamically significant. 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 The left vertebral artery is otherwise patent to the skull base. Moderate stenosis in the right V1 (series 15, image 247). The right vertebral artery is otherwise patent to the skull base. Skeleton: No acute osseous abnormality. Degenerative changes in the cervical spine. Other neck: 1 cm hypoenhancing nodule in the left thyroid lobe, for which no follow-up is currently indicated. (Reference: J Am Coll Radiol. 2015 Feb;12(2): 143-50) Upper chest: No focal pulmonary opacity or pleural effusion. Review of the MIP images confirms the above findings CTA HEAD FINDINGS Anterior circulation: Minimal opacification of right ICA until the supraclinoid segment, where there is severe calcification, after which the right ICA is patent and normal in caliber. Moderate narrowing of the proximal left cavernous ICA (series 15, image 110) and mild stenosis in the distal left cavernous ICA. Moderate stenosis in the proximal left supraclinoid ICA. The left ICA is patent to terminus. A1 segments patent. Normal anterior communicating artery. Anterior cerebral arteries are patent to their distal aspects without significant stenosis. No M1 stenosis or occlusion. MCA branches perfused to their distal aspects without significant stenosis. Posterior circulation: Vertebral arteries patent to the vertebrobasilar junction without significant stenosis. Posterior inferior cerebellar arteries patent proximally. Basilar patent to its distal aspect without significant stenosis. Superior cerebellar arteries patent proximally. Patent P1 segments. PCAs  perfused to their distal aspects without significant stenosis. The bilateral posterior communicating arteries are not visualized. Venous sinuses: As permitted by contrast timing, patent. Anatomic variants: None significant. No evidence of aneurysm or vascular malformation. Review of the MIP images confirms the above findings IMPRESSION: 1. Near complete nonopacification of the right ICA just distal to its origin, with an appearance concerning for dissection versus extensive thrombosis. Minimal flow is seen throughout the remainder of the cervical ICA, with reconstitution in the mid supraclinoid ICA, just distal to an area of focal calcification. 2. At least 75% narrowing at the origin of the left ICA, similarly concerning for thrombosis versus dissection, with irregularity in the proximal left ICA and possible linear filling defect in the mid left ICA, which could  also represent dissection. Moderate narrowing in the left cavernous and supraclinoid ICA. 3. Severe narrowing at the origin of the left vertebral artery and in the left V1. Moderate stenosis in the right V1. 4. Severe narrowing at the origin of the bilateral ECAs, with additional multifocal narrowing in 1 of the right ECA branches. 5. Hypodensity in the right body of the corpus callosum, best seen on the coronal images, which correlates with the acute infarct noted on the 02/24/2023 MRI. Additional smaller foci of acute infarct in the periventricular white matter, right temporal lobe, and right parietal lobe are not well seen on this exam, which may be due to motion. Code stroke imaging results were communicated on 02/25/2023 at 2:20 pm to provider BHAGAT via secure text paging. Electronically Signed   By: Wiliam Ke M.D.   On: 02/25/2023 14:20   ECHOCARDIOGRAM COMPLETE Result Date: 02/25/2023    ECHOCARDIOGRAM REPORT   Patient Name:   TONEKA FULLEN Date of Exam: 02/25/2023 Medical Rec #:  [id number]       Height:       61.0 in Accession #:    [id number]       Weight:       196.8 lb Date of Birth:  07/28/1980       BSA:          1.876 m Patient Age:    42 years        BP:           145/71 mmHg Patient Gender: F               HR:           83 bpm. Exam Location:  [facility] Procedure: 2D Echo, Cardiac Doppler and Color Doppler Indications:     Stroke  History:         Patient has no prior history of Echocardiogram examinations.                  Stroke; Risk Factors:Hypertension, Diabetes and Dyslipidemia.  Sonographer:     Mikki Harbor Referring Phys:  [id number] OLADAPO ADEFESO Diagnosing Phys: Lorine Bears MD  Sonographer Comments: Image acquisition challenging due to respiratory motion. IMPRESSIONS  1. Left ventricular ejection fraction, by estimation, is 55 to 60%. The left ventricle has normal function. The left ventricle has no regional wall motion abnormalities. Left ventricular diastolic parameters are consistent with Grade I diastolic dysfunction (impaired relaxation).  2. Right ventricular systolic function is normal. The right ventricular size is normal.  3. The mitral valve is normal in structure. No evidence of mitral valve regurgitation. No evidence of mitral stenosis.  4. The aortic valve is normal in structure. Aortic valve regurgitation is not visualized. No aortic stenosis is present.  5. The inferior vena cava is normal in size with greater than 50% respiratory variability, suggesting right atrial pressure of 3 mmHg.  6. The interatrial septum is aneurysmal. No atrial level shunt detected by color flow Doppler. FINDINGS  Left Ventricle: Left ventricular ejection fraction, by estimation, is 55 to 60%. The left ventricle has normal function. The left ventricle has no regional wall motion abnormalities. The left ventricular internal cavity size was normal in size. There is  no left ventricular hypertrophy. Left ventricular diastolic parameters are consistent with Grade I diastolic dysfunction (impaired relaxation). Right Ventricle: The right ventricular  size is normal. No increase in right ventricular wall thickness. Right ventricular systolic function is normal. Left Atrium: Left atrial size was  normal in size. Right Atrium: Right atrial size was normal in size. Pericardium: There is no evidence of pericardial effusion. Mitral Valve: The mitral valve is normal in structure. No evidence of mitral valve regurgitation. No evidence of mitral valve stenosis. MV peak gradient, 5.2 mmHg. The mean mitral valve gradient is 2.0 mmHg. Tricuspid Valve: The tricuspid valve is normal in structure. Tricuspid valve regurgitation is not demonstrated. No evidence of tricuspid stenosis. Aortic Valve: The aortic valve is normal in structure. Aortic valve regurgitation is not visualized. No aortic stenosis is present. Aortic valve mean gradient measures 4.0 mmHg. Aortic valve peak gradient measures 8.0 mmHg. Aortic valve area, by VTI measures 2.56 cm. Pulmonic Valve: The pulmonic valve was normal in structure. Pulmonic valve regurgitation is trivial. No evidence of pulmonic stenosis. Aorta: The aortic root is normal in size and structure. Venous: The inferior vena cava is normal in size with greater than 50% respiratory variability, suggesting right atrial pressure of 3 mmHg. IAS/Shunts: The interatrial septum is aneurysmal. No atrial level shunt detected by color flow Doppler.  LEFT VENTRICLE PLAX 2D LVIDd:         4.00 cm   Diastology LVIDs:         3.00 cm   LV e' medial:    0.04 cm/s LV PW:         1.20 cm   LV E/e' medial:  17.4 LV IVS:        1.00 cm   LV e' lateral:   0.06 cm/s LVOT diam:     2.00 cm   LV E/e' lateral: 10.3 LV SV:         71 LV SV Index:   38 LVOT Area:     3.14 cm  RIGHT VENTRICLE RV Basal diam:  2.60 cm RV Mid diam:    2.30 cm RV S prime:     11.70 cm/s TAPSE (M-mode): 2.2 cm LEFT ATRIUM             Index        RIGHT ATRIUM           Index LA diam:        3.60 cm 1.92 cm/m   RA Area:     11.10 cm LA Vol (A2C):   33.2 ml 17.70 ml/m  RA Volume:   23.00 ml   12.26 ml/m LA Vol (A4C):   41.2 ml 21.96 ml/m LA Biplane Vol: 39.9 ml 21.27 ml/m  AORTIC VALVE                    PULMONIC VALVE AV Area (Vmax):    2.45 cm     PV Vmax:       0.96 m/s AV Area (Vmean):   2.39 cm     PV Peak grad:  3.7 mmHg AV Area (VTI):     2.56 cm AV Vmax:           141.00 cm/s AV Vmean:          90.500 cm/s AV VTI:            0.279 m AV Peak Grad:      8.0 mmHg AV Mean Grad:      4.0 mmHg LVOT Vmax:         110.00 cm/s LVOT Vmean:        68.900 cm/s LVOT VTI:          0.227 m LVOT/AV VTI ratio: 0.81  AORTA  Ao Root diam: 2.80 cm MITRAL VALVE MV Area (PHT): 3.99 cm    SHUNTS MV Area VTI:   2.58 cm    Systemic VTI:  0.23 m MV Peak grad:  5.2 mmHg    Systemic Diam: 2.00 cm MV Mean grad:  2.0 mmHg MV Vmax:       1.14 m/s MV Vmean:      63.3 cm/s MV Decel Time: 190 msec MV E velocity: 0.66 cm/s MV A velocity: 96.40 cm/s MV E/A ratio:  0.01 Lorine Bears MD Electronically signed by Lorine Bears MD Signature Date/Time: 02/25/2023/12:10:46 PM    Final    MR BRAIN WO CONTRAST Result Date: 02/24/2023 CLINICAL DATA:  eval cva. slurred speech and facial droop EXAM: MRI HEAD WITHOUT CONTRAST TECHNIQUE: Multiplanar, multiecho pulse sequences of the brain and surrounding structures were obtained without intravenous contrast. COMPARISON:  CT head. FINDINGS: Brain: Multiple small acute infarcts in the right frontal lobe and right parietal lobe. Additional small acute infarct in the medial right temporal lobe and infarct in the body of the corpus callosum. Edema without substantial mass effect. Remote left frontal cortical infarct. Mild to moderate scattered T2/FLAIR hyperintensities not matter compatible with chronic microvascular disease. Vascular: Major arterial flow voids are maintained. Skull and upper cervical spine: Normal marrow signal. Sinuses/Orbits: Moderate paranasal sinus mucosal thickening. No acute orbital findings. Other: No sizable mastoid effusions. IMPRESSION: Acute infarcts in the  right frontal, parietal, and temporal lobes and the right body of the corpus callosum. Electronically Signed   By: Feliberto Harts M.D.   On: 02/24/2023 23:21   CT HEAD CODE STROKE WO CONTRAST Result Date: 02/24/2023 CLINICAL DATA:  Code stroke.  Slurred speech and facial droop EXAM: CT HEAD WITHOUT CONTRAST TECHNIQUE: Contiguous axial images were obtained from the base of the skull through the vertex without intravenous contrast. RADIATION DOSE REDUCTION: This exam was performed according to the departmental dose-optimization program which includes automated exposure control, adjustment of the mA and/or kV according to patient size and/or use of iterative reconstruction technique. COMPARISON:  None Available. FINDINGS: Brain: There is no mass, hemorrhage or extra-axial collection. The size and configuration of the ventricles and extra-axial CSF spaces are normal. The brain parenchyma is normal, without evidence of acute or chronic infarction. Incidental partially empty sella turcica. Vascular: No abnormal hyperdensity of the major intracranial arteries or dural venous sinuses. No intracranial atherosclerosis. Skull: The visualized skull base, calvarium and extracranial soft tissues are normal. Sinuses/Orbits: No fluid levels or advanced mucosal thickening of the visualized paranasal sinuses. No mastoid or middle ear effusion. The orbits are normal. ASPECTS [facility] Stroke Program Early CT Score) - Ganglionic level infarction (caudate, lentiform nuclei, internal capsule, insula, M1-M3 cortex): 7 - Supraganglionic infarction (M4-M6 cortex): 3 Total score (0-10 with 10 being normal): 10 IMPRESSION: 1. No acute intracranial abnormality. 2. ASPECTS is 10. These results were called by telephone at the time of interpretation on 02/24/2023 at 9:44 pm to provider [facility] , who verbally acknowledged these results. Electronically Signed   By: Deatra Robinson M.D.   On: 02/24/2023 21:44    Assessment/Plan  Bilateral  carotid artery stenosis CT angiogram which I have independently reviewed which demonstrated what appeared to be carotid dissections with occlusion or near occlusion in the tract distally beyond the base of the skull on the right.  There was a 75% or greater stenosis in the proximal left internal carotid artery cervically.  The appearance of dissection and a 43 year old female, fibromuscular dysplasia  would be highly suspected.  This appears to have disease that passes beyond the base of the skull on the right, and she has had much for treatment done previously at [facility] so I am going to refer her to a neuro interventionalists at [facility] for further evaluation.  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  I am happy to treat her for this going forward but since her right side is the currently symptomatic side, this will need addressed first.  Acute ischemic stroke [facility]) Treatment as above  Essential hypertension blood glucose control important in reducing the progression of atherosclerotic disease. Also, involved in wound healing. On appropriate medications.   Mixed hyperlipidemia lipid control important in reducing the progression of atherosclerotic disease. Continue statin therapy    Festus Barren, MD  03/05/2023 3:07 PM    This note was created with Dragon medical transcription system.  Any errors from dictation are purely unintentional

## 2023-03-05 NOTE — Assessment & Plan Note (Signed)
CT angiogram which I have independently reviewed which demonstrated what appeared to be carotid dissections with occlusion or near occlusion in the tract distally beyond the base of the skull on the right.  There was a 75% or greater stenosis in the proximal left internal carotid artery cervically.  The appearance of dissection and a 43 year old female, fibromuscular dysplasia would be highly suspected.  This appears to have disease that passes beyond the base of the skull on the right, and she has had much for treatment done previously at [facility] so I am going to refer her to a neuro interventionalists at [facility] for further evaluation.  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  I am happy to treat her for this going forward but since her right side is the currently symptomatic side, this will need addressed first.

## 2023-03-05 NOTE — Assessment & Plan Note (Signed)
lipid control important in reducing the progression of atherosclerotic disease. Continue statin therapy
